# Patient Record
Sex: Female | Born: 2000 | Race: Black or African American | Hispanic: No | Marital: Single | Smoking: Never smoker
Health system: Southern US, Community
[De-identification: ages and names within clinical notes are randomized; demographics above are authoritative.]

## PROBLEM LIST (undated history)

## (undated) ENCOUNTER — Inpatient Hospital Stay (HOSPITAL_COMMUNITY): Payer: Self-pay

## (undated) DIAGNOSIS — D649 Anemia, unspecified: Secondary | ICD-10-CM

## (undated) DIAGNOSIS — Z789 Other specified health status: Secondary | ICD-10-CM

## (undated) HISTORY — PX: NO PAST SURGERIES: SHX2092

## (undated) HISTORY — DX: Other specified health status: Z78.9

---
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
## 2022-03-07 NOTE — L&D Delivery Note (Signed)
OB/GYN Faculty Practice Delivery Note  Katie Baldwin is a 22 y.o. G2P1001 s/p nsvd at [redacted]w[redacted]d. She was admitted for active labor.   ROM: 0h 61m with meconium-stained fluid GBS Status:  Negative/-- (10/25 0000) Maximum Maternal Temperature: 97.5  Labor Progress: Initial SVE: 8. She then progressed to complete with AROM performed after placement of spina.  Delivery Date/Time: 11:32 Delivery: Called to room and patient was complete and pushing. Head delivered OA. No nuchal cord present. Shoulder and body delivered in usual fashion. Infant with spontaneous cry, placed on mother's abdomen, dried and stimulated. Cord clamped x 2 after 1-minute delay, and cut by father. Cord blood drawn. Placenta delivered spontaneously with gentle cord traction. Fundus firm with massage and Pitocin. Labia, perineum, vagina, and cervix inspected inspected with hemostatic superficial peri-urethral lacerations that were not repaired, no perineal laceration .  Baby Weight: pending  Placenta: Sent to L&D Complications: None Lacerations: two hemostatic peri-urethral lacerations not repaired EBL: 102 mL Analgesia: Spinal   Infant:  APGAR (1 MIN):   APGAR (5 MINS):   APGAR (10 MINS):     Shonna Chock, MD [facility], [facility] 02/03/2023, 11:42 AM

## 2022-05-31 ENCOUNTER — Encounter (HOSPITAL_COMMUNITY): Payer: Self-pay | Admitting: Emergency Medicine

## 2022-05-31 ENCOUNTER — Ambulatory Visit (HOSPITAL_COMMUNITY)
Admission: EM | Admit: 2022-05-31 | Discharge: 2022-05-31 | Disposition: A | Discharge status: Home / Self Care | Payer: Medicaid Other | Attending: Family Medicine | Admitting: Family Medicine

## 2022-05-31 DIAGNOSIS — N910 Primary amenorrhea: Secondary | ICD-10-CM

## 2022-05-31 DIAGNOSIS — Z3201 Encounter for pregnancy test, result positive: Secondary | ICD-10-CM

## 2022-05-31 LAB — POC URINE PREG, ED: Preg Test, Ur: POSITIVE — AB

## 2022-05-31 NOTE — Discharge Instructions (Signed)
The pregnancy test is positive here.

## 2022-05-31 NOTE — ED Triage Notes (Signed)
Pt reports LMP 2/17, had one day this month where had dark brown blood. Reports home test positive for pregnancy, requesting testing to confirm.

## 2022-05-31 NOTE — ED Provider Notes (Signed)
[place]    CSN: [id number] Arrival date & time: 05/31/22  1359      History   Chief Complaint Chief Complaint  Patient presents with   Possible Pregnancy    HPI Katie Baldwin is a 22 y.o. female.    Possible Pregnancy   Here for irregular bleeding.  Last normal menstrual cycle was February 17.  Then on March 16 she had a little bit of spotting of some dark blood and then that resolved.  In the last week or 10 days, she has had some nausea but no vomiting.  Maybe some breast tenderness.  No cramping, but she is having a little pressure in her lower abdomen.  No dysuria.  Past Medical History:  Diagnosis Date   Anemia    Medical history non-contributory     Patient Active Problem List   Diagnosis Date Noted   Normal labor 07/29/2021   Gestational thrombocytopenia ([place]) 07/08/2021   Anemia of mother in pregnancy, antepartum 01/25/2021   Encounter for supervision of normal first pregnancy 01/18/2021    Past Surgical History:  Procedure Laterality Date   NO PAST SURGERIES      OB History     Gravida  1   Para  1   Term  1   Preterm      AB      Living  1      SAB      IAB      Ectopic      Multiple  0   Live Births  1            Home Medications    Prior to Admission medications   Not on File    Family History Family History  Problem Relation Age of Onset   Healthy Mother    Asthma Brother    Asthma Maternal Grandmother     Social History Social History   Tobacco Use   Smoking status: Never   Smokeless tobacco: Never  Vaping Use   Vaping Use: Never used  Substance Use Topics   Alcohol use: Never   Drug use: Not Currently    Types: Marijuana    Comment: last used marijuana 11/26/2020     Allergies   Patient has no known allergies.   Review of Systems Review of Systems   Physical Exam Triage Vital Signs ED Triage Vitals  Enc Vitals Group     BP 05/31/22 1445 107/61     Pulse Rate  05/31/22 1445 65     Resp 05/31/22 1445 14     Temp 05/31/22 1445 98.5 F (36.9 C)     Temp Source 05/31/22 1445 Oral     SpO2 05/31/22 1445 97 %     Weight --      Height --      Head Circumference --      Peak Flow --      Pain Score 05/31/22 1444 0     Pain Loc --      Pain Edu? --      Excl. in [place]? --    No data found.  Updated Vital Signs BP 107/61 (BP Location: Left Arm)   Pulse 65   Temp 98.5 F (36.9 C) (Oral)   Resp 14   LMP 04/23/2022   SpO2 97%   Visual Acuity Right Eye Distance:   Left Eye Distance:   Bilateral Distance:    Right Eye Near:   Left Eye  Near:    Bilateral Near:     Physical Exam Vitals reviewed.  Constitutional:      General: She is not in acute distress.    Appearance: She is not toxic-appearing.  HENT:     Mouth/Throat:     Mouth: Mucous membranes are moist.  Cardiovascular:     Rate and Rhythm: Normal rate and regular rhythm.  Pulmonary:     Effort: Pulmonary effort is normal.     Breath sounds: Normal breath sounds.  Abdominal:     Palpations: Abdomen is soft.     Tenderness: There is no abdominal tenderness.  Skin:    Coloration: Skin is not jaundiced or pale.  Neurological:     Mental Status: She is alert and oriented to person, place, and time.  Psychiatric:        Behavior: Behavior normal.      UC Treatments / Results  Labs (all labs ordered are listed, but only abnormal results are displayed) Labs Reviewed  POC URINE PREG, ED - Abnormal; Notable for the following components:      Result Value   Preg Test, Ur POSITIVE (*)    All other components within normal limits    EKG   Radiology No results found.  Procedures Procedures (including critical care time)  Medications Ordered in UC Medications - No data to display  Initial Impression / Assessment and Plan / UC Course  I have reviewed the triage vital signs and the nursing notes.  Pertinent labs & imaging results that were available during my care  of the patient were reviewed by me and considered in my medical decision making (see chart for details).        Her pregnancy test is positive.  She is given contact information for OB/GYN. Final Clinical Impressions(s) / UC Diagnoses   Final diagnoses:  Delayed period  Positive pregnancy test     Discharge Instructions      The pregnancy test is positive here.       ED Prescriptions   None    PDMP not reviewed this encounter.   Barrett Henle, MD 05/31/22 [phone]

## 2022-06-08 ENCOUNTER — Encounter (HOSPITAL_COMMUNITY): Payer: Self-pay | Admitting: *Deleted

## 2022-06-08 ENCOUNTER — Inpatient Hospital Stay (HOSPITAL_COMMUNITY)
Admission: AD | Admit: 2022-06-08 | Discharge: 2022-06-08 | Disposition: A | Discharge status: Home / Self Care | Payer: Medicaid Other | Attending: Obstetrics and Gynecology | Admitting: Obstetrics and Gynecology

## 2022-06-08 ENCOUNTER — Inpatient Hospital Stay (HOSPITAL_COMMUNITY): Payer: Medicaid Other

## 2022-06-08 DIAGNOSIS — O99891 Other specified diseases and conditions complicating pregnancy: Secondary | ICD-10-CM | POA: Insufficient documentation

## 2022-06-08 DIAGNOSIS — O26899 Other specified pregnancy related conditions, unspecified trimester: Secondary | ICD-10-CM

## 2022-06-08 DIAGNOSIS — Z3A01 Less than 8 weeks gestation of pregnancy: Secondary | ICD-10-CM | POA: Insufficient documentation

## 2022-06-08 DIAGNOSIS — O99282 Endocrine, nutritional and metabolic diseases complicating pregnancy, second trimester: Secondary | ICD-10-CM | POA: Diagnosis not present

## 2022-06-08 DIAGNOSIS — O99112 Other diseases of the blood and blood-forming organs and certain disorders involving the immune mechanism complicating pregnancy, second trimester: Secondary | ICD-10-CM | POA: Insufficient documentation

## 2022-06-08 DIAGNOSIS — D696 Thrombocytopenia, unspecified: Secondary | ICD-10-CM | POA: Insufficient documentation

## 2022-06-08 DIAGNOSIS — O26891 Other specified pregnancy related conditions, first trimester: Secondary | ICD-10-CM | POA: Insufficient documentation

## 2022-06-08 DIAGNOSIS — O99111 Other diseases of the blood and blood-forming organs and certain disorders involving the immune mechanism complicating pregnancy, first trimester: Secondary | ICD-10-CM | POA: Diagnosis not present

## 2022-06-08 DIAGNOSIS — R109 Unspecified abdominal pain: Secondary | ICD-10-CM

## 2022-06-08 DIAGNOSIS — O99281 Endocrine, nutritional and metabolic diseases complicating pregnancy, first trimester: Secondary | ICD-10-CM | POA: Diagnosis not present

## 2022-06-08 DIAGNOSIS — O219 Vomiting of pregnancy, unspecified: Secondary | ICD-10-CM

## 2022-06-08 LAB — URINALYSIS, ROUTINE W REFLEX MICROSCOPIC
Bilirubin Urine: NEGATIVE
Glucose, UA: NEGATIVE mg/dL
Hgb urine dipstick: NEGATIVE
Ketones, ur: 5 mg/dL — AB
Nitrite: NEGATIVE
Protein, ur: 30 mg/dL — AB
Specific Gravity, Urine: 1.028 (ref 1.005–1.030)
pH: 6 (ref 5.0–8.0)

## 2022-06-08 LAB — COMPREHENSIVE METABOLIC PANEL
ALT: 10 U/L (ref 0–44)
AST: 16 U/L (ref 15–41)
Albumin: 4.1 g/dL (ref 3.5–5.0)
Alkaline Phosphatase: 40 U/L (ref 38–126)
Anion gap: 7 (ref 5–15)
BUN: 7 mg/dL (ref 6–20)
CO2: 24 mmol/L (ref 22–32)
Calcium: 9.2 mg/dL (ref 8.9–10.3)
Chloride: 104 mmol/L (ref 98–111)
Creatinine, Ser: 0.59 mg/dL (ref 0.44–1.00)
GFR, Estimated: >60 mL/min (ref >60)
Glucose, Bld: 77 mg/dL (ref 70–99)
Potassium: 3.3 mmol/L — ABNORMAL LOW (ref 3.5–5.1)
Sodium: 135 mmol/L (ref 135–145)
Total Bilirubin: 0.6 mg/dL (ref 0.3–1.2)
Total Protein: 7.3 g/dL (ref 6.5–8.1)

## 2022-06-08 LAB — CBC
HCT: 32.2 % — ABNORMAL LOW (ref 36.0–46.0)
Hemoglobin: 10.1 g/dL — ABNORMAL LOW (ref 12.0–15.0)
MCH: 25 pg — ABNORMAL LOW (ref 26.0–34.0)
MCHC: 31.4 g/dL (ref 30.0–36.0)
MCV: 79.7 fL — ABNORMAL LOW (ref 80.0–100.0)
Platelets: 130 10*3/uL — ABNORMAL LOW (ref 150–400)
RBC: 4.04 MIL/uL (ref 3.87–5.11)
RDW: 15 % (ref 11.5–15.5)
WBC: 4.2 10*3/uL (ref 4.0–10.5)
nRBC: 0 % (ref 0.0–0.2)

## 2022-06-08 LAB — HCG, QUANTITATIVE, PREGNANCY: hCG, Beta Chain, Quant, S: 33014 m[IU]/mL — ABNORMAL HIGH (ref <5)

## 2022-06-08 MED ORDER — FAMOTIDINE IN NACL 20-0.9 MG/50ML-% IV SOLN
20.0000 mg | Freq: Once | INTRAVENOUS | Status: AC
  Administered 2022-06-08: 20 mg via INTRAVENOUS
  Filled 2022-06-08: qty 50

## 2022-06-08 MED ORDER — METOCLOPRAMIDE HCL 5 MG/ML IJ SOLN
10.0000 mg | Freq: Once | INTRAMUSCULAR | Status: AC
  Administered 2022-06-08: 10 mg via INTRAVENOUS
  Filled 2022-06-08: qty 2

## 2022-06-08 MED ORDER — FAMOTIDINE 20 MG PO TABS: 20.0000 mg | ORAL_TABLET | Freq: Two times a day (BID) | ORAL | 3 refills | Status: DC

## 2022-06-08 MED ORDER — LACTATED RINGERS IV BOLUS
1000.0000 mL | Freq: Once | INTRAVENOUS | Status: AC
  Administered 2022-06-08: 1000 mL via INTRAVENOUS

## 2022-06-08 MED ORDER — METOCLOPRAMIDE HCL 10 MG PO TABS: 10.0000 mg | ORAL_TABLET | Freq: Four times a day (QID) | ORAL | 3 refills | Status: DC

## 2022-06-08 NOTE — Discharge Instructions (Signed)
Safe Medications in Pregnancy    Acne: Benzoyl Peroxide Salicylic Acid  Backache/Headache: Tylenol: 2 regular strength every 4 hours OR              2 Extra strength every 6 hours  Colds/Coughs/Allergies: Benadryl (alcohol free) 25 mg every 6 hours as needed Breath right strips Claritin Cepacol throat lozenges Chloraseptic throat spray Cold-Eeze- up to three times per day Cough drops, alcohol free Flonase (by prescription only) Guaifenesin Mucinex Robitussin DM (plain only, alcohol free) Saline nasal spray/drops Sudafed (pseudoephedrine) & Actifed ** use only after [redacted] weeks gestation and if you do not have high blood pressure Tylenol Vicks Vaporub Zinc lozenges Zyrtec   Constipation: Colace Ducolax suppositories Fleet enema Glycerin suppositories Metamucil Milk of magnesia Miralax Senokot Smooth move tea  Diarrhea: Kaopectate Imodium A-D  *NO pepto Bismol  Hemorrhoids: Anusol Anusol HC Preparation H Tucks  Indigestion: Tums Maalox Mylanta Zantac  Pepcid  Insomnia: Benadryl (alcohol free) 25mg every 6 hours as needed Tylenol PM Unisom, no Gelcaps  Leg Cramps: Tums MagGel  Nausea/Vomiting:  Bonine Dramamine Emetrol Ginger extract Sea bands Meclizine  Nausea medication to take during pregnancy:  Unisom (doxylamine succinate 25 mg tablets) Take one tablet daily at bedtime. If symptoms are not adequately controlled, the dose can be increased to a maximum recommended dose of two tablets daily (1/2 tablet in the morning, 1/2 tablet mid-afternoon and one at bedtime). Vitamin B6 100mg tablets. Take one tablet twice a day (up to 200 mg per day).  Skin Rashes: Aveeno products Benadryl cream or 25mg every 6 hours as needed Calamine Lotion 1% cortisone cream  Yeast infection: Gyne-lotrimin 7 Monistat 7   **If taking multiple medications, please check labels to avoid duplicating the same active ingredients **take  medication as directed on the label ** Do not exceed 4000 mg of tylenol in 24 hours **Do not take medications that contain aspirin or ibuprofen   [facility] Providers   [facility]             [address], [place], [place] [address] [phone]  [facility]                                                             [address], [address], [place], [place], [address] [phone]  [facility]                                    [address], [address], [place], [place], [address] [phone]  [facility] [place] [address], [address], [place], [place], [address] [phone]  [facility]                                 [address], [place], [place], [address] [phone]  [facility]                                      [address], [place], [place], [address] [phone]  [facility] [address], [address], [place], [place], [address]                              [facility] [address], [place], [place], [address] [phone]  [facility]         Phone: [phone]  [facility]      Phone: [phone]   [facility]      Phone: [phone]  [facility]         Phone: [phone]   [facility]    Phone: [phone]  [facility]      Phone: [phone]  [facility]     Phone: [phone]  [facility]        Phone: [phone]  [facility]      Phone: [phone]  

## 2022-06-08 NOTE — MAU Provider Note (Cosign Needed Addendum)
History     CSN: [id number]  Arrival date and time: 06/08/22 1840   Event Date/Time   First Provider Initiated Contact with Patient 06/08/22 1936      Chief Complaint  Patient presents with   Abdominal Pain   Emesis   Abdominal Pain Associated symptoms include constipation, diarrhea, nausea and vomiting. Pertinent negatives include no dysuria, fever, frequency, headaches, myalgias, rash or sore throat.  Emesis  Associated symptoms include abdominal pain, diarrhea and dizziness. Pertinent negatives include no chest pain, chills, coughing, fever, headaches or myalgias.   Pt is a 22 y.o. at [redacted]w[redacted]d presenting for vomiting and abdominal pain. The pain and vomiting started 3 days ago. States the pain has been gradually getting worse and is primarily in her epigastric region. Pain is rated 5/10 and is constant. She also reports extreme nausea and vomiting the past 3 days. She has not been able to keep any food or fluids down. Reports throwing up around 15x in the last 24 hours. She tried to eat a banana and ginger ale today and was unable to keep it down. She also reports weakness, lightheadedness, dizziness, and constipation followed by diarrhea. She denies any fever, chills, bloating, chest pain, or vaginal bleeding.  OB History     Gravida  2   Para  1   Term  1   Preterm      AB      Living  1      SAB      IAB      Ectopic      Multiple  0   Live Births  1           Past Medical History:  Diagnosis Date   Anemia    Medical history non-contributory     Past Surgical History:  Procedure Laterality Date   NO PAST SURGERIES      Family History  Problem Relation Age of Onset   Healthy Mother    Asthma Brother    Asthma Maternal Grandmother     Social History   Tobacco Use   Smoking status: Never   Smokeless tobacco: Never  Vaping Use   Vaping Use: Never used  Substance Use Topics   Alcohol use: Never   Drug use: Not Currently    Types: Marijuana     Comment: last used marijuana 11/26/2020    Allergies: No Known Allergies  No medications prior to admission.    Review of Systems  Constitutional:  Positive for fatigue. Negative for chills and fever.  HENT:  Negative for congestion, rhinorrhea and sore throat.   Eyes:  Negative for discharge and redness.  Respiratory:  Negative for cough and shortness of breath.   Cardiovascular:  Negative for chest pain.  Gastrointestinal:  Positive for abdominal pain, constipation, diarrhea, nausea and vomiting. Negative for abdominal distention.  Genitourinary:  Negative for dysuria, frequency, urgency, vaginal bleeding and vaginal discharge.  Musculoskeletal:  Negative for back pain and myalgias.  Skin:  Negative for pallor and rash.  Neurological:  Positive for dizziness, weakness and light-headedness. Negative for syncope and headaches.  Psychiatric/Behavioral:  Negative for agitation and behavioral problems.    Physical Exam   Blood pressure 131/66, pulse 81, temperature 99.1 F (37.3 C), temperature source Oral, resp. rate 16, height 5\' 3"  (1.6 m), weight 53.3 kg, last menstrual period 04/23/2022, SpO2 98 %, unknown if currently breastfeeding.  Physical Exam Constitutional:      General: She is not in acute  distress.    Appearance: She is well-developed.  HENT:     Head: Normocephalic and atraumatic.  Cardiovascular:     Rate and Rhythm: Normal rate and regular rhythm.  Pulmonary:     Effort: Pulmonary effort is normal.     Breath sounds: Normal breath sounds.  Abdominal:     General: Abdomen is flat. Bowel sounds are normal. There is no distension.     Palpations: Abdomen is soft.     Tenderness: There is abdominal tenderness in the right upper quadrant, right lower quadrant, epigastric area and left upper quadrant. There is no guarding.  Skin:    General: Skin is warm and dry.  Neurological:     General: No focal deficit present.     Mental Status: She is alert and oriented  to person, place, and time.  Psychiatric:        Mood and Affect: Mood normal.        Behavior: Behavior normal.     MAU Course  Procedures Results for orders placed or performed during the hospital encounter of 06/08/22 (from the past 24 hour(s))  Urinalysis, Routine w reflex microscopic -Urine, Clean Catch     Status: Abnormal   Collection Time: 06/08/22  7:15 PM  Result Value Ref Range   Color, Urine YELLOW YELLOW   APPearance HAZY (A) CLEAR   Specific Gravity, Urine 1.028 1.005 - 1.030   pH 6.0 5.0 - 8.0   Glucose, UA NEGATIVE NEGATIVE mg/dL   Hgb urine dipstick NEGATIVE NEGATIVE   Bilirubin Urine NEGATIVE NEGATIVE   Ketones, ur 5 (A) NEGATIVE mg/dL   Protein, ur 30 (A) NEGATIVE mg/dL   Nitrite NEGATIVE NEGATIVE   Leukocytes,Ua TRACE (A) NEGATIVE   RBC / HPF 0-5 0 - 5 RBC/hpf   WBC, UA 0-5 0 - 5 WBC/hpf   Bacteria, UA RARE (A) NONE SEEN   Squamous Epithelial / HPF 0-5 0 - 5 /HPF   Mucus PRESENT   CBC     Status: Abnormal   Collection Time: 06/08/22  8:00 PM  Result Value Ref Range   WBC 4.2 4.0 - 10.5 K/uL   RBC 4.04 3.87 - 5.11 MIL/uL   Hemoglobin 10.1 (L) 12.0 - 15.0 g/dL   HCT 32.2 (L) 36.0 - 46.0 %   MCV 79.7 (L) 80.0 - 100.0 fL   MCH 25.0 (L) 26.0 - 34.0 pg   MCHC 31.4 30.0 - 36.0 g/dL   RDW 15.0 11.5 - 15.5 %   Platelets 130 (L) 150 - 400 K/uL   nRBC 0.0 0.0 - 0.2 %  Comprehensive metabolic panel     Status: Abnormal   Collection Time: 06/08/22  8:00 PM  Result Value Ref Range   Sodium 135 135 - 145 mmol/L   Potassium 3.3 (L) 3.5 - 5.1 mmol/L   Chloride 104 98 - 111 mmol/L   CO2 24 22 - 32 mmol/L   Glucose, Bld 77 70 - 99 mg/dL   BUN 7 6 - 20 mg/dL   Creatinine, Ser 0.59 0.44 - 1.00 mg/dL   Calcium 9.2 8.9 - 10.3 mg/dL   Total Protein 7.3 6.5 - 8.1 g/dL   Albumin 4.1 3.5 - 5.0 g/dL   AST 16 15 - 41 U/L   ALT 10 0 - 44 U/L   Alkaline Phosphatase 40 38 - 126 U/L   Total Bilirubin 0.6 0.3 - 1.2 mg/dL   GFR, Estimated >60 >60 mL/min   Anion gap 7 5 -  63   US OB LESS THAN 14 WEEKS WITH OB TRANSVAGINAL  Result Date: 06/08/2022 CLINICAL DATA:  Abdominal pain for 3 days, pregnant EXAM: OBSTETRIC <14 WK [place] AND TRANSVAGINAL OB US TECHNIQUE: Both transabdominal and transvaginal ultrasound examinations were performed for complete evaluation of the gestation as well as the maternal uterus, adnexal regions, and pelvic cul-de-sac. Transvaginal technique was performed to assess early pregnancy. COMPARISON:  None Available. FINDINGS: Intrauterine gestational sac: Single Yolk sac:  Visualized. Embryo:  Visualized. Cardiac Activity: Visualized. Heart Rate: 102 bpm CRL:  3.4 mm   6 w   0 d                  [place] EDC: 02/01/2023 Subchorionic hemorrhage:  None visualized. Maternal uterus/adnexae: There are no adnexal masses. Trace pelvic free fluid likely physiologic. IMPRESSION: 1. Single live intrauterine pregnancy as above, estimated age 39 weeks and 0 days. 2. Trace pelvic free fluid, likely physiologic. Electronically Signed   By: Randa Ngo M.D.   On: 06/08/2022 20:59    MDM UA: Ketones 5. Protein 30, Trace leukocyte, rare bacteria  CBC: Hgb 10.1, Hct 32.2, Plt 130  CMP: K 3.3 Transvaginal US: IUP present  Reglan, Pepcid, Zofran and Lactated Ringers   Assessment and Plan  Nausea and Vomiting Abdominal Pain - Pepcid, Reglan, and Zofran sent to pharmacy - Encouraged bland diet and lots of fluids - Hospital precautions given   Thrombocytopenia  -Plt count improved since 07/29/21 -Continue to monitor levels   6w Gestation -US shows IUP -[facility] to establish [facility] 06/08/2022, 9:42 PM   CNM attestation:  I have seen and examined this patient and agree with above documentation in the PA student's note.   Katie Baldwin is a 22 y.o. G2P1001 at [redacted]w[redacted]d reporting nausea, vomiting, and abdominal pain. Patient reports symptoms started 3 days ago. She reports she has not been able to keep anything down. She endorses  approximately 15 episodes of emesis in the last 24 hours. She tried to eat a banana and drink ginger ale prior to coming in and was not able to keep it down. She is reporting some pain in her mid to upper abdomen. Pain is constant and is described as a burning sensation. She also reports feeling overall weak and dehydrated. Patient denies vaginal bleeding, fever, or urinary s/s.   PE: Patient Vitals for the past 24 hrs:  BP Temp Temp src Pulse Resp SpO2 Height Weight  06/08/22 1900 131/66 99.1 F (37.3 C) Oral 81 16 98 % 5\' 3"  (1.6 m) 53.3 kg   Gen: calm, comfortable, no acute distress Resp: normal effort, no distress Heart: regular rate Abd: soft, some generalized tenderness, no guarding  Results for orders placed or performed during the hospital encounter of 06/08/22 (from the past 24 hour(s))  Urinalysis, Routine w reflex microscopic -Urine, Clean Catch     Status: Abnormal   Collection Time: 06/08/22  7:15 PM  Result Value Ref Range   Color, Urine YELLOW YELLOW   APPearance HAZY (A) CLEAR   Specific Gravity, Urine 1.028 1.005 - 1.030   pH 6.0 5.0 - 8.0   Glucose, UA NEGATIVE NEGATIVE mg/dL   Hgb urine dipstick NEGATIVE NEGATIVE   Bilirubin Urine NEGATIVE NEGATIVE   Ketones, ur 5 (A) NEGATIVE mg/dL   Protein, ur 30 (A) NEGATIVE mg/dL   Nitrite NEGATIVE NEGATIVE   Leukocytes,Ua TRACE (A) NEGATIVE   RBC / HPF 0-5 0 - 5  RBC/hpf   WBC, UA 0-5 0 - 5 WBC/hpf   Bacteria, UA RARE (A) NONE SEEN   Squamous Epithelial / HPF 0-5 0 - 5 /HPF   Mucus PRESENT   CBC     Status: Abnormal   Collection Time: 06/08/22  8:00 PM  Result Value Ref Range   WBC 4.2 4.0 - 10.5 K/uL   RBC 4.04 3.87 - 5.11 MIL/uL   Hemoglobin 10.1 (L) 12.0 - 15.0 g/dL   HCT 32.2 (L) 36.0 - 46.0 %   MCV 79.7 (L) 80.0 - 100.0 fL   MCH 25.0 (L) 26.0 - 34.0 pg   MCHC 31.4 30.0 - 36.0 g/dL   RDW 15.0 11.5 - 15.5 %   Platelets 130 (L) 150 - 400 K/uL   nRBC 0.0 0.0 - 0.2 %  Comprehensive metabolic panel     Status:  Abnormal   Collection Time: 06/08/22  8:00 PM  Result Value Ref Range   Sodium 135 135 - 145 mmol/L   Potassium 3.3 (L) 3.5 - 5.1 mmol/L   Chloride 104 98 - 111 mmol/L   CO2 24 22 - 32 mmol/L   Glucose, Bld 77 70 - 99 mg/dL   BUN 7 6 - 20 mg/dL   Creatinine, Ser 0.59 0.44 - 1.00 mg/dL   Calcium 9.2 8.9 - 10.3 mg/dL   Total Protein 7.3 6.5 - 8.1 g/dL   Albumin 4.1 3.5 - 5.0 g/dL   AST 16 15 - 41 U/L   ALT 10 0 - 44 U/L   Alkaline Phosphatase 40 38 - 126 U/L   Total Bilirubin 0.6 0.3 - 1.2 mg/dL   GFR, Estimated >60 >60 mL/min   Anion gap 7 5 - 15   US OB LESS THAN 14 WEEKS WITH OB TRANSVAGINAL  Result Date: 06/08/2022 CLINICAL DATA:  Abdominal pain for 3 days, pregnant EXAM: OBSTETRIC <14 WK [place] AND TRANSVAGINAL OB US TECHNIQUE: Both transabdominal and transvaginal ultrasound examinations were performed for complete evaluation of the gestation as well as the maternal uterus, adnexal regions, and pelvic cul-de-sac. Transvaginal technique was performed to assess early pregnancy. COMPARISON:  None Available. FINDINGS: Intrauterine gestational sac: Single Yolk sac:  Visualized. Embryo:  Visualized. Cardiac Activity: Visualized. Heart Rate: 102 bpm CRL:  3.4 mm   6 w   0 d                  [place] EDC: 02/01/2023 Subchorionic hemorrhage:  None visualized. Maternal uterus/adnexae: There are no adnexal masses. Trace pelvic free fluid likely physiologic. IMPRESSION: 1. Single live intrauterine pregnancy as above, estimated age 46 weeks and 0 days. 2. Trace pelvic free fluid, likely physiologic. Electronically Signed   By: Randa Ngo M.D.   On: 06/08/2022 20:59    ROS, labs, PMH reviewed  Orders Placed This Encounter  Procedures   US OB LESS THAN 14 WEEKS WITH OB TRANSVAGINAL   Urinalysis, Routine w reflex microscopic -Urine, Clean Catch   CBC   Comprehensive metabolic panel   hCG, quantitative, pregnancy   Discharge patient   Meds ordered this encounter  Medications   lactated ringers bolus  1,000 mL   metoCLOPramide (REGLAN) injection 10 mg   famotidine (PEPCID) IVPB 20 mg premix   famotidine (PEPCID) 20 MG tablet    Sig: Take 1 tablet (20 mg total) by mouth 2 (two) times daily.    Dispense:  60 tablet    Refill:  3    Order Specific Question:  Supervising Provider    Answer:   Clarnce Flock [id number]   metoCLOPramide (REGLAN) 10 MG tablet    Sig: Take 1 tablet (10 mg total) by mouth every 6 (six) hours.    Dispense:  60 tablet    Refill:  3    Order Specific Question:   Supervising Provider    Answer:   Clarnce Flock [id number]    MDM UA CBC, CMP, HCG [place] LR bolus, Reglan, Pepcid  UA and labs reassuring. Nausea improved with medications. No episodes of vomiting in MAU. US shows SIUP with FHR present.  Assessment: 1. [redacted] weeks gestation of pregnancy   2. Abdominal pain affecting pregnancy   3. Nausea and vomiting during pregnancy prior to [redacted] weeks gestation     Plan: - Discharge home in stable condition - Return precautions given. Return to MAU as needed - Rx for Reglan and Pepcid sent - List of OBGYN's provided. Call to establish [place], CNM 06/08/2022 9:53 PM

## 2022-06-08 NOTE — MAU Note (Signed)
Katie Baldwin is a 22 y.o. at [redacted]w[redacted]d here in MAU reporting: found out preg last wk.  Has been having abd pain, started 3 days ago, mid to upper abd. Pain keeps her awake, makes her toss and turn.  Can't keep down anything, this also started about 3 days. Any time she throws up, she gets dizzy.  Has been feeling weak.  Been trying not to come, but the pain got a little worse today.  Muscle pain from throwing up. Has had a little diarrhea today.  Onset of complaint: 3 days Pain score: 5 Vitals:   06/08/22 1900  BP: 131/66  Pulse: 81  Resp: 16  Temp: 99.1 F (37.3 C)  SpO2: 98%      Lab orders placed from triage:  urine

## 2022-07-17 IMAGING — US US OB < 14 WEEKS - US OB TV
1 series · 15 of 28 positions shown · non-contrast
Comparison: None.

CLINICAL DATA: Vaginal bleeding.

EXAM:
OBSTETRIC <14 WK US AND TRANSVAGINAL OB US
TECHNIQUE: Both transabdominal and transvaginal ultrasound examinations were
performed for complete evaluation of the gestation as well as the
maternal uterus, adnexal regions, and pelvic cul-de-sac.
Transvaginal technique was performed to assess early pregnancy.

[Series 1: us ob < 14 weeks - us ob tv · 15 of 57 slices shown]
[im 1/57]
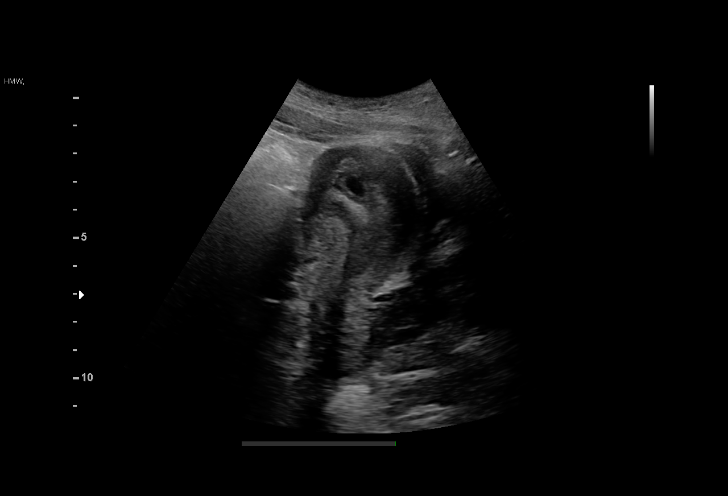
[im 5/57]
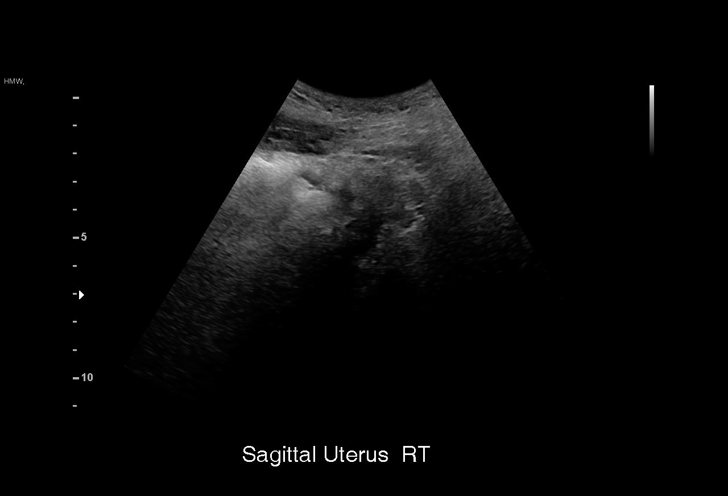
[im 9/57]
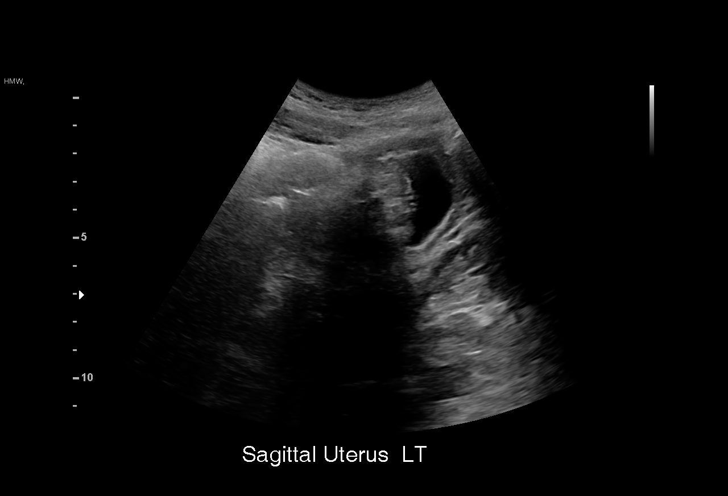
[im 13/57]
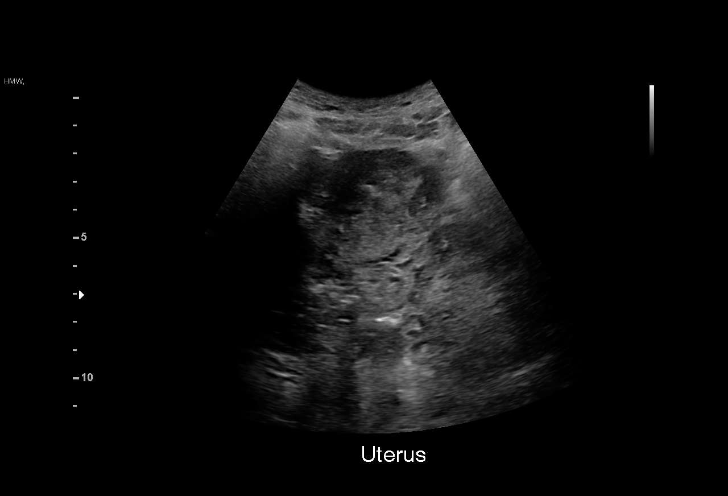
[im 17/57]
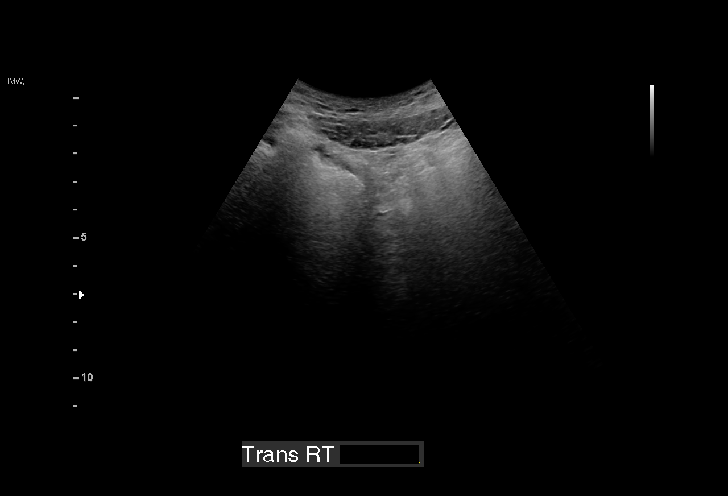
[im 21/57]
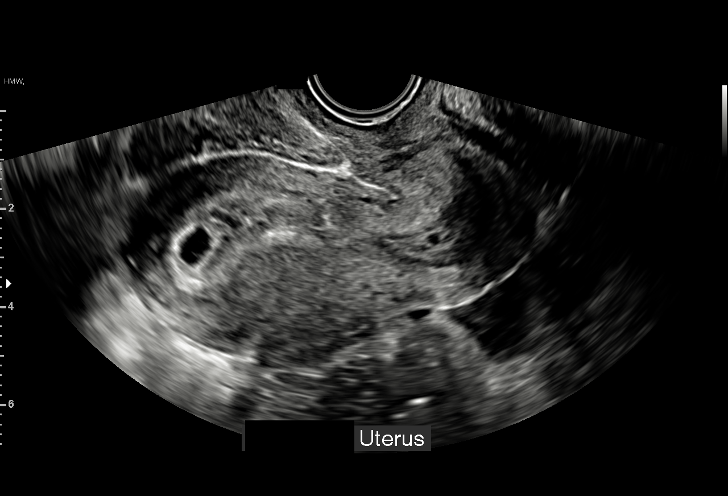
[im 25/57]
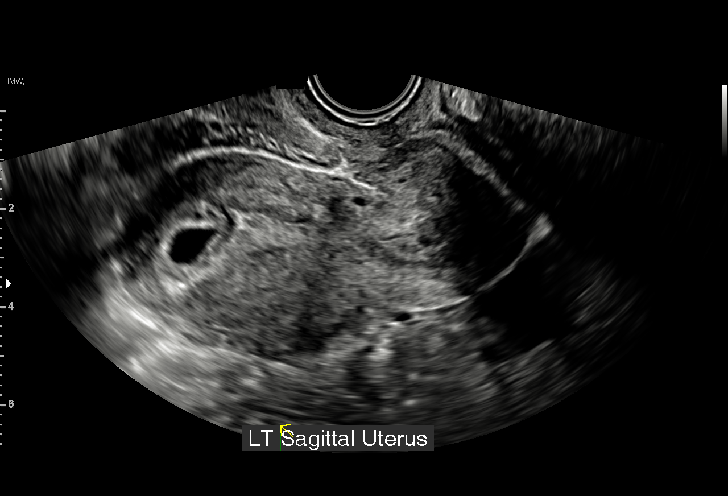
[im 30/57]
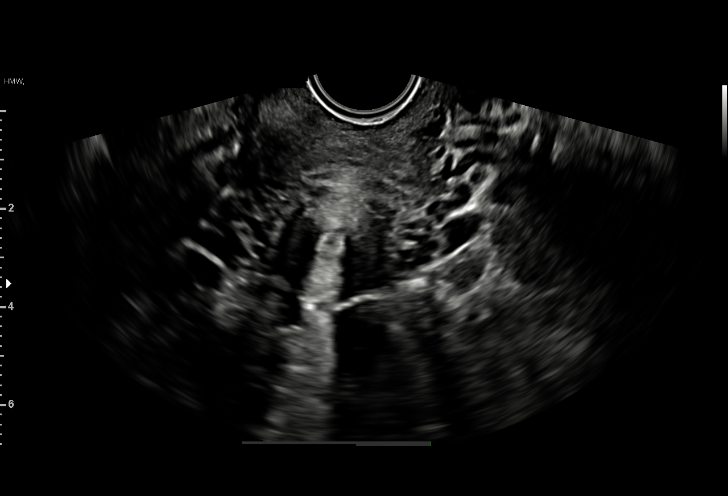
[im 32/57]
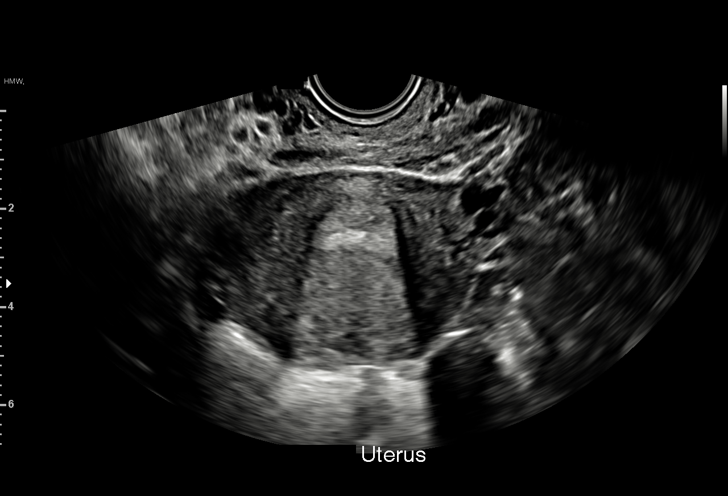
[im 36/57]
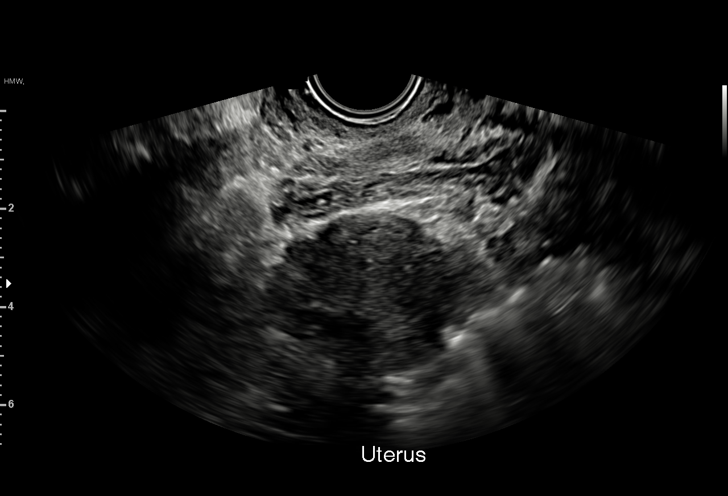
[im 40/57]
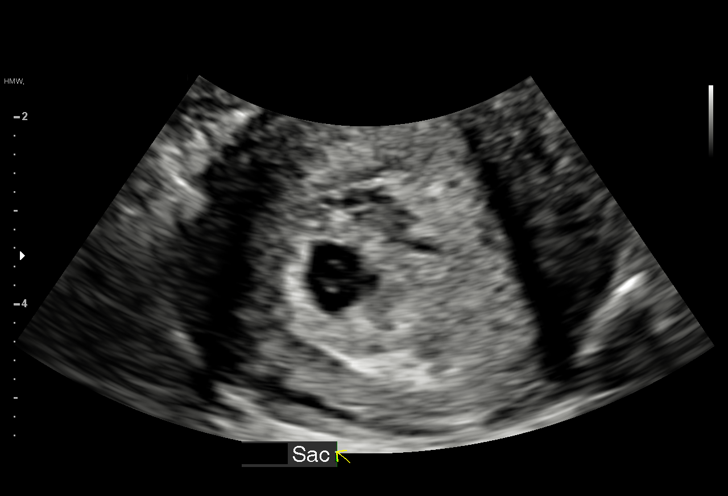
[im 44/57]
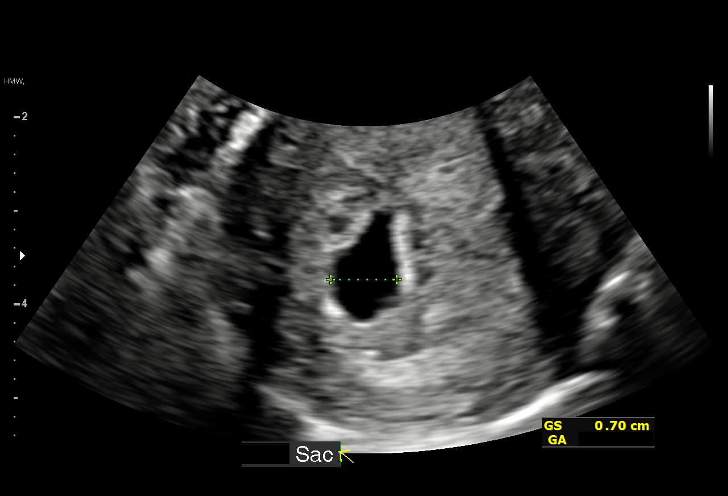
[im 48/57]
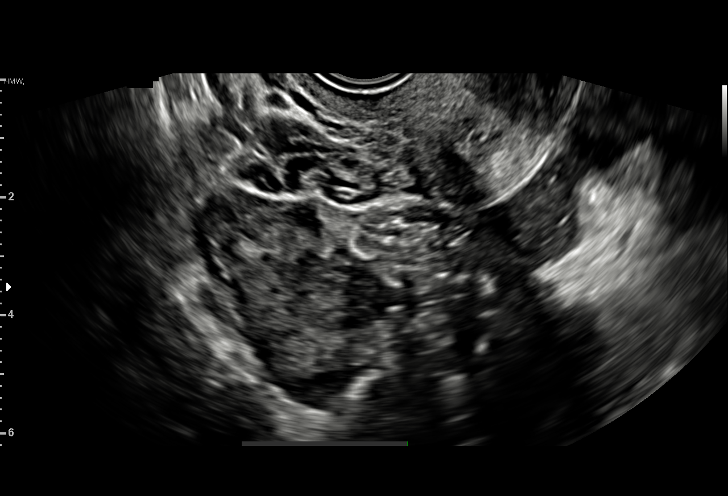
[im 52/57]
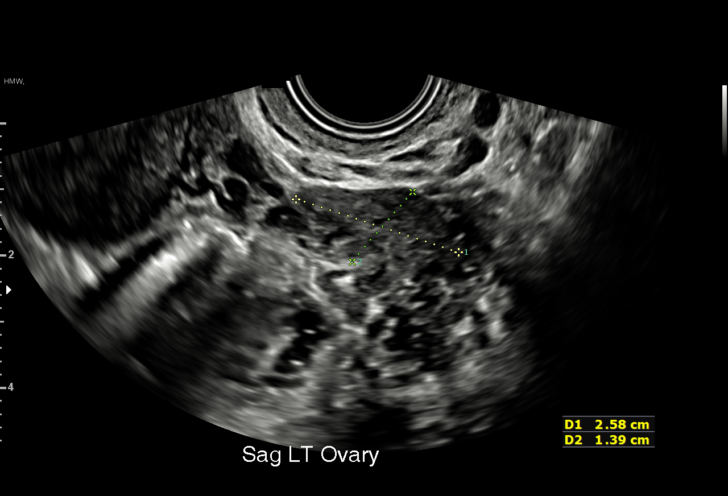
[im 57/57]
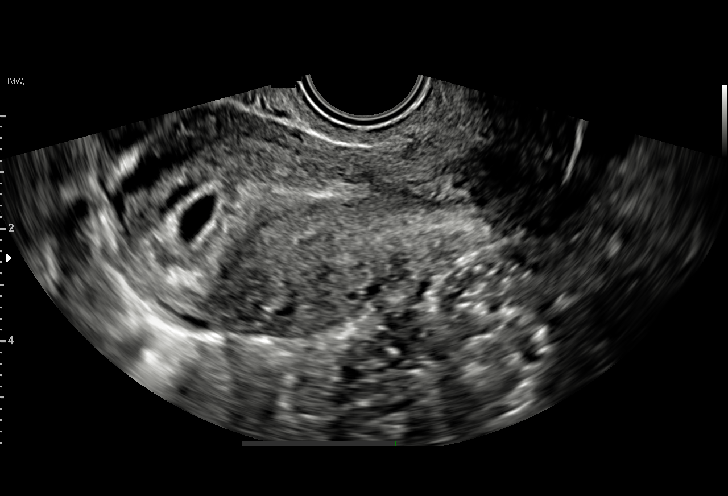

[15 of 28 positions shown; findings below may reference images not displayed]

FINDINGS: Intrauterine gestational sac: Present

Yolk sac:  Present

Embryo:  None

Cardiac Activity: N/a

Heart Rate: N/a bpm

MSD: 10.9 mm   5 w   5 d

Subchorionic hemorrhage:  Small to moderate

Maternal uterus/adnexae: Normal ovaries.
IMPRESSION: Intrauterine gestational sac estimated at 5 weeks and 5 days
gestation. A yolk sac is present but no embryonic pole. Recommend
follow-up quantitative B-HCG levels and follow-up US in 14 days to
assess viability. This recommendation follows SRU consensus
guidelines: Diagnostic Criteria for Nonviable Pregnancy Early in the
First Trimester. N Engl J Med 7279; [DATE].

Small to moderate-sized subchorionic hemorrhage.

## 2022-08-02 IMAGING — US US OB TRANSVAGINAL
1 series · 15 of 28 positions shown · non-contrast
Comparison: 12/01/2020

CLINICAL DATA: Follow-up, confirm viability

EXAM:
TRANSVAGINAL OB ULTRASOUND
TECHNIQUE: Transvaginal ultrasound was performed for complete evaluation of the
gestation as well as the maternal uterus, adnexal regions, and
pelvic cul-de-sac.

[Series 1: us ob transvaginal · 15 of 66 slices shown]
[im 1/66]
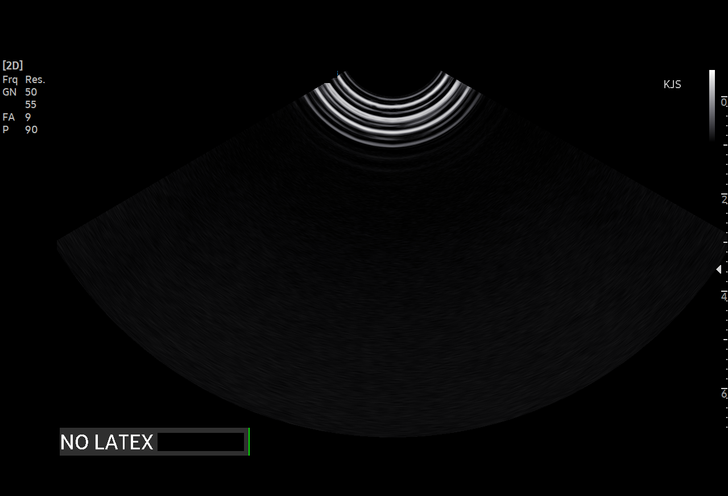
[im 5/66]
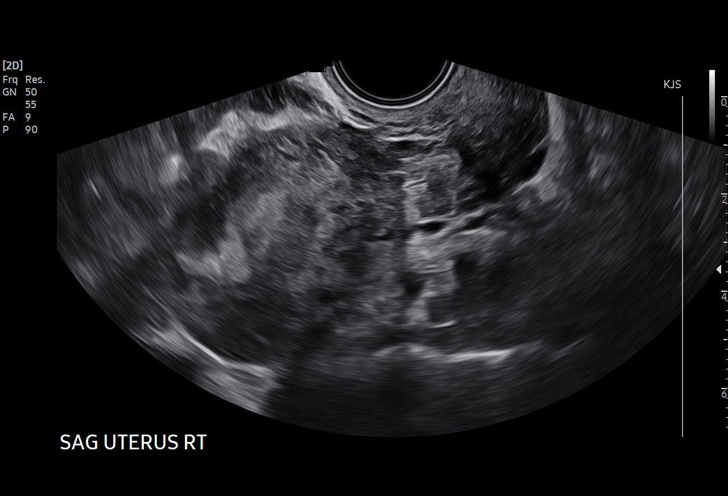
[im 10/66]
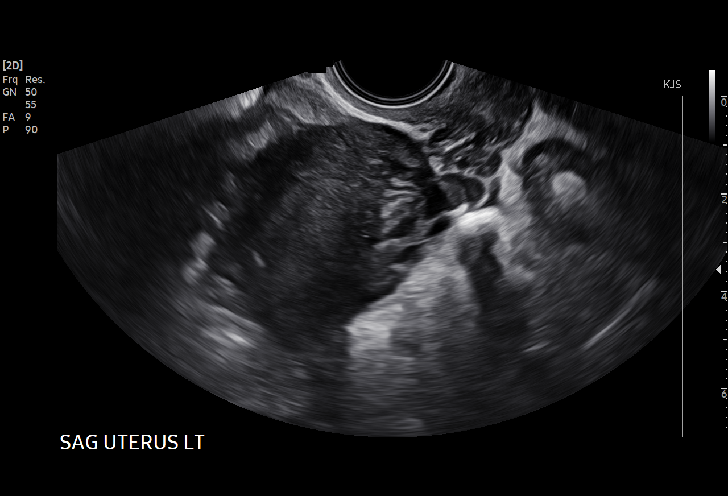
[im 15/66]
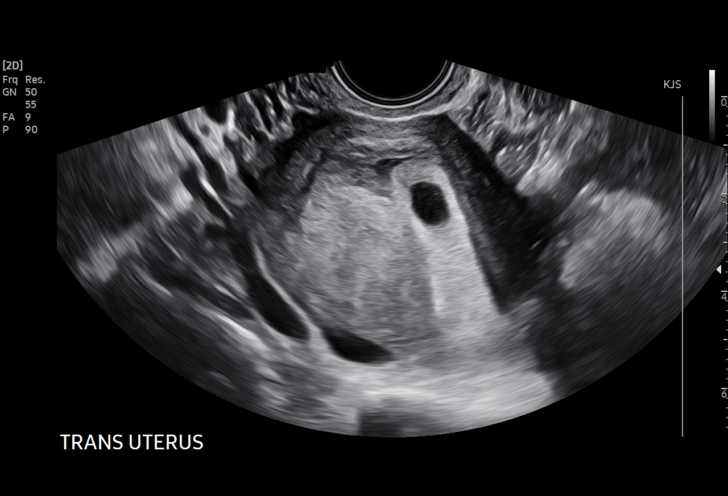
[im 20/66]
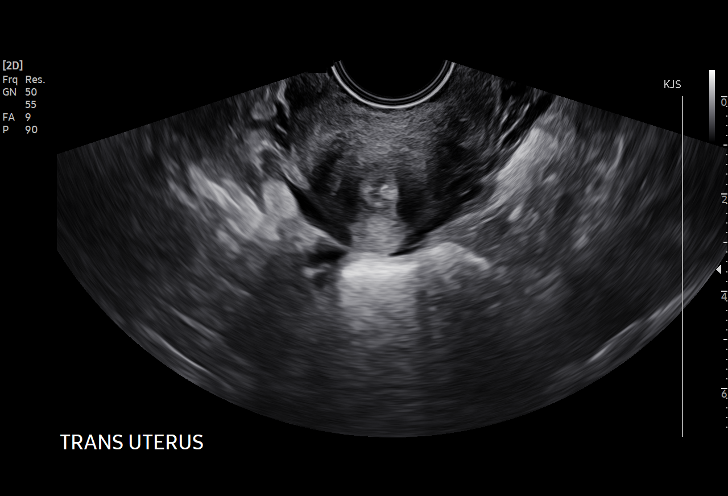
[im 25/66]
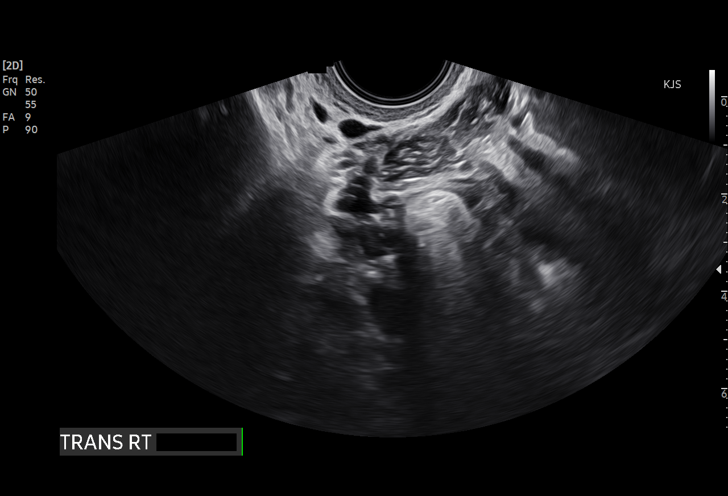
[im 29/66]
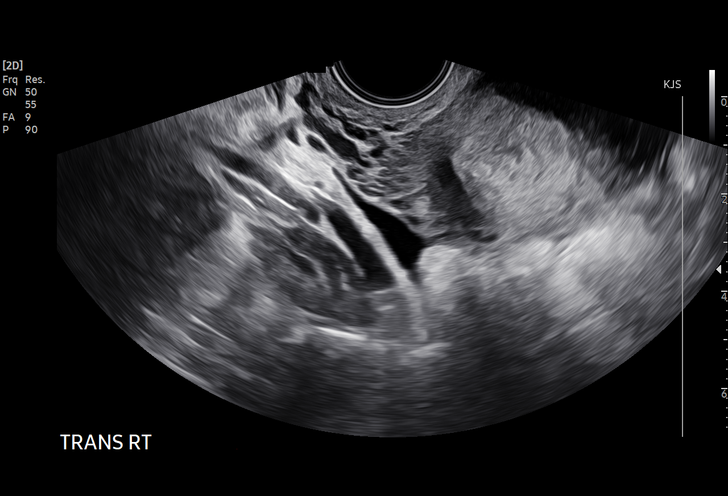
[im 34/66]
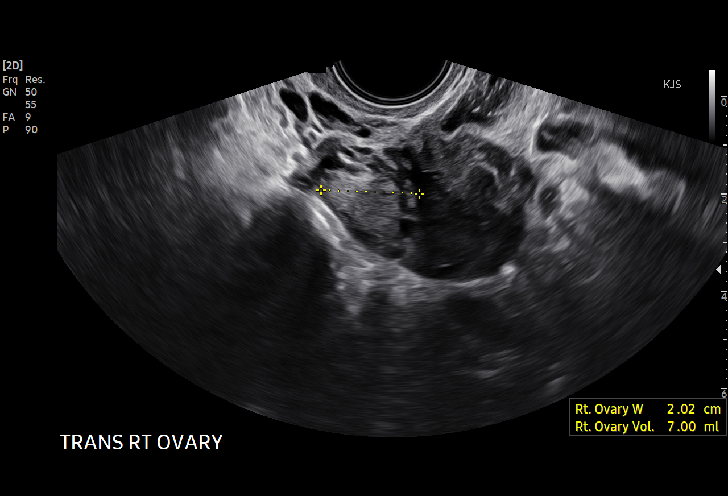
[im 37/66]
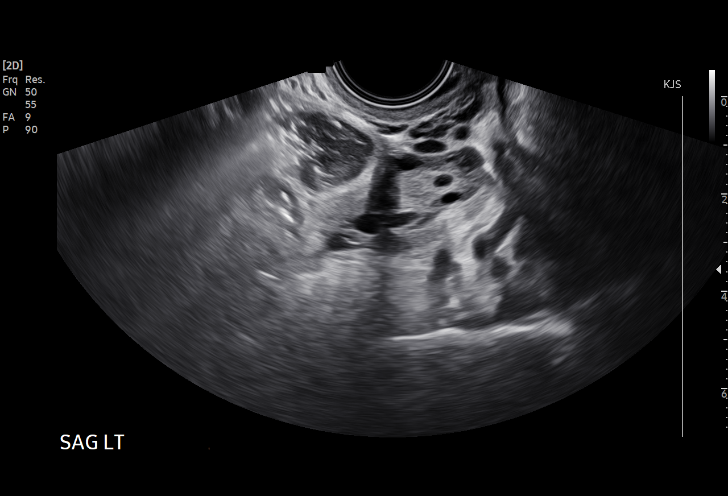
[im 41/66]
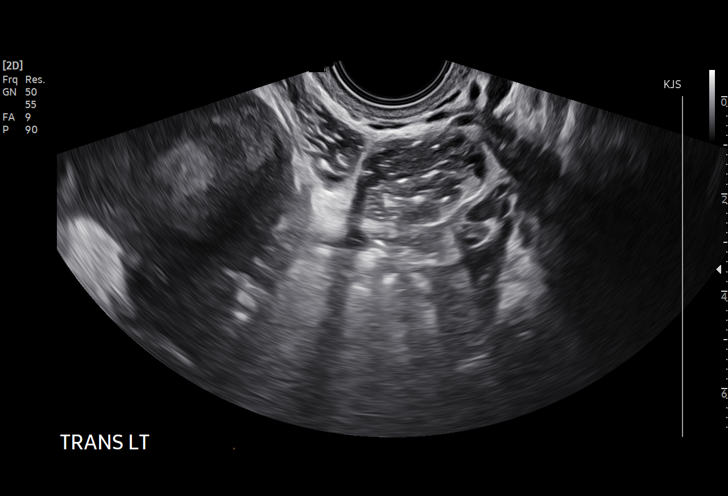
[im 46/66]
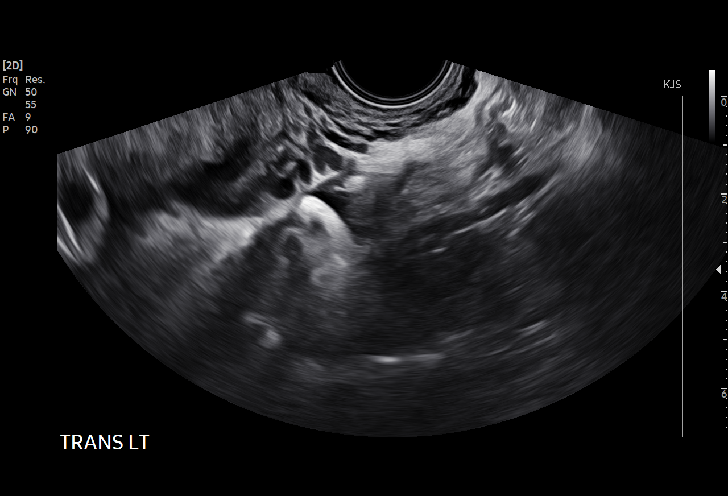
[im 51/66]
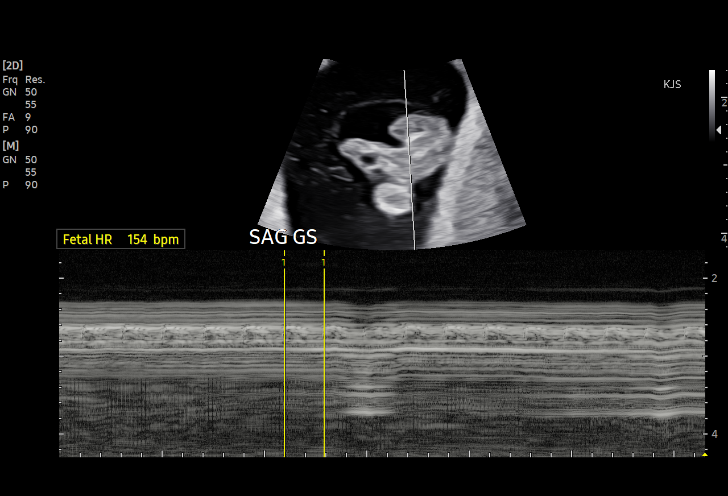
[im 56/66]
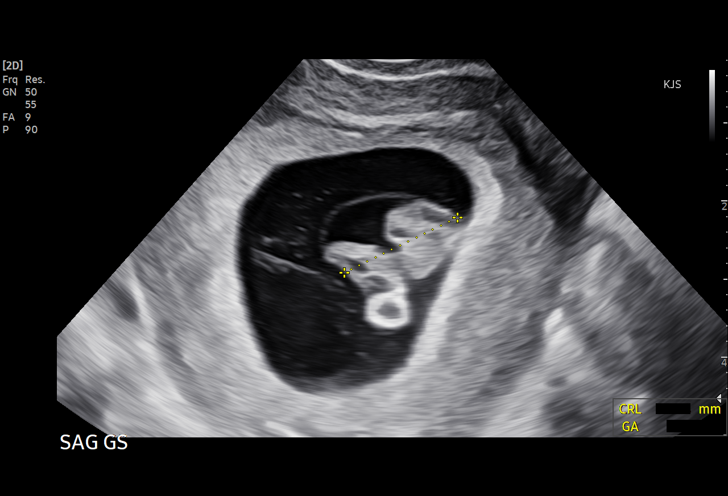
[im 61/66]
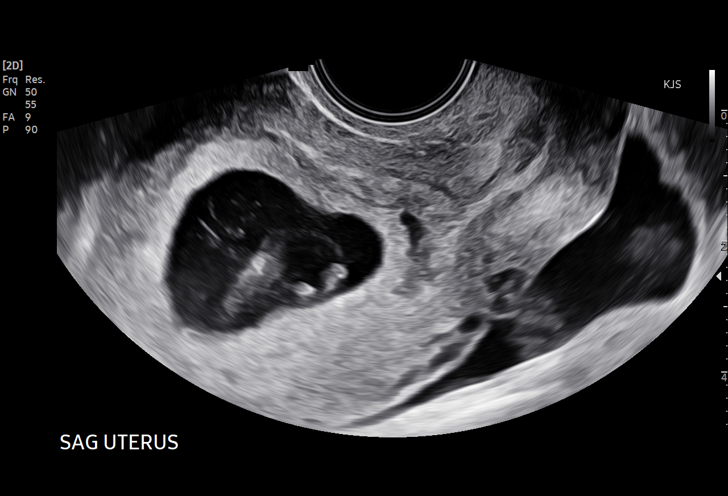
[im 66/66]
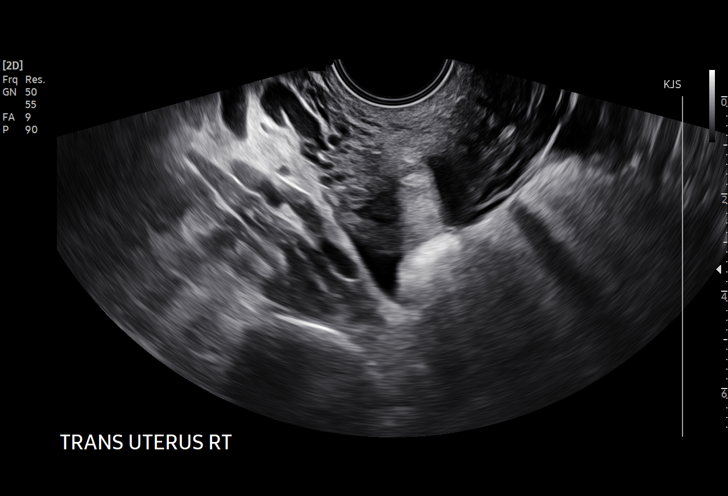

[15 of 28 positions shown; findings below may reference images not displayed]

FINDINGS: Intrauterine gestational sac: Single

Yolk sac:  Visualized.

Embryo:  Visualized.

Cardiac Activity: Visualized.

Heart Rate: 154 bpm

CRL:   16 mm   8 w 0 d                  US EDC: 07/29/2021

Subchorionic hemorrhage:  Small.

Maternal uterus/adnexae: Unremarkable.
IMPRESSION: 1. Single intrauterine gestation at sonographic gestational age of 8
weeks, 0 days. Fetal heart rate 154 bpm. EDD 07/29/2021.

2.  Small subchorionic hemorrhage.

## 2022-09-30 IMAGING — US US ABDOMEN LIMITED
1 series · 15 of 25 positions shown · non-contrast
Comparison: None.

CLINICAL DATA: Epigastric pain, evaluate for gallstones

EXAM:
ULTRASOUND ABDOMEN LIMITED RIGHT UPPER QUADRANT

[Series 1: us abdomen limited · 15 of 42 slices shown]
[im 1/42]
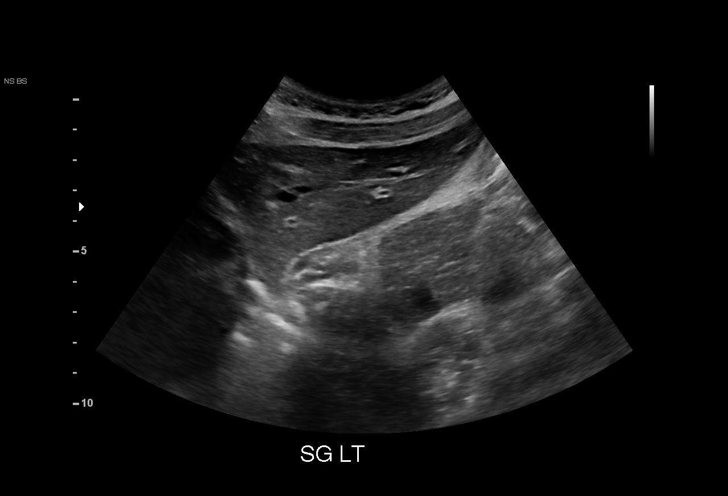
[im 4/42]
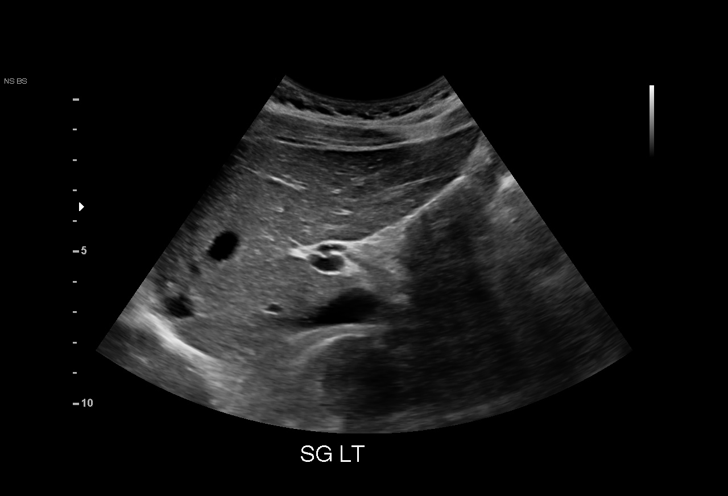
[im 7/42]
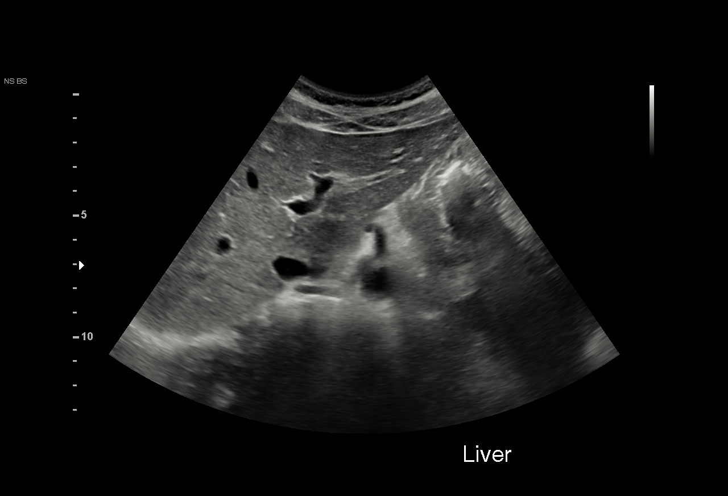
[im 9/42]
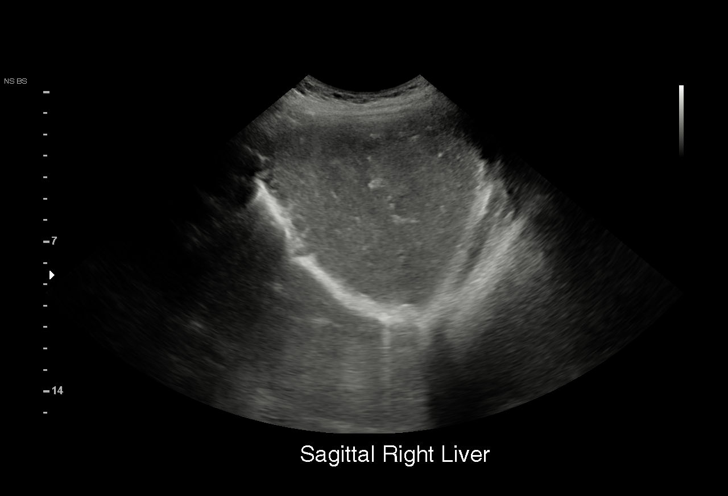
[im 12/42]
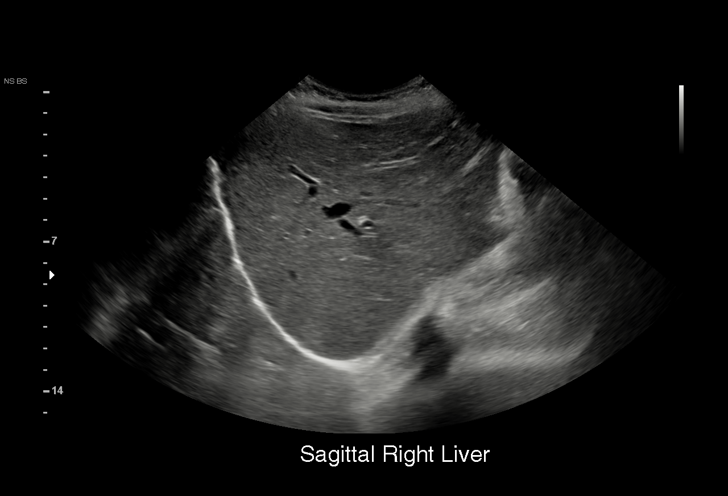
[im 16/42]
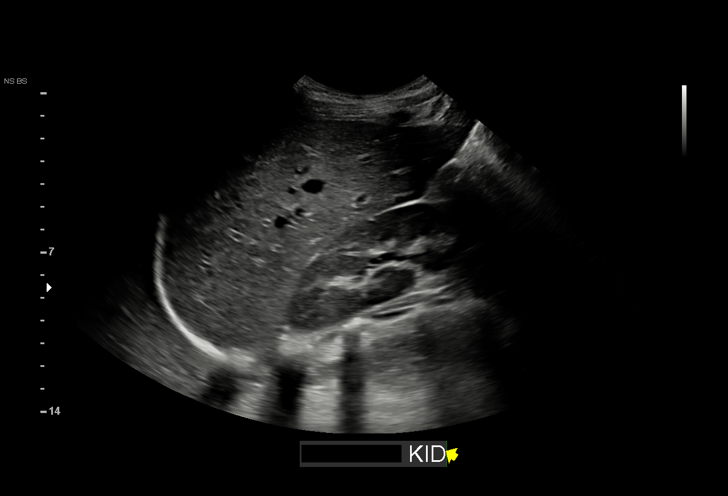
[im 18/42]
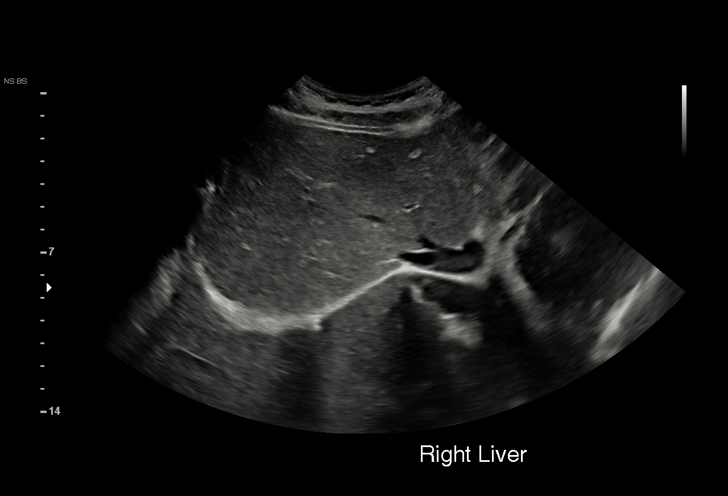
[im 21/42]
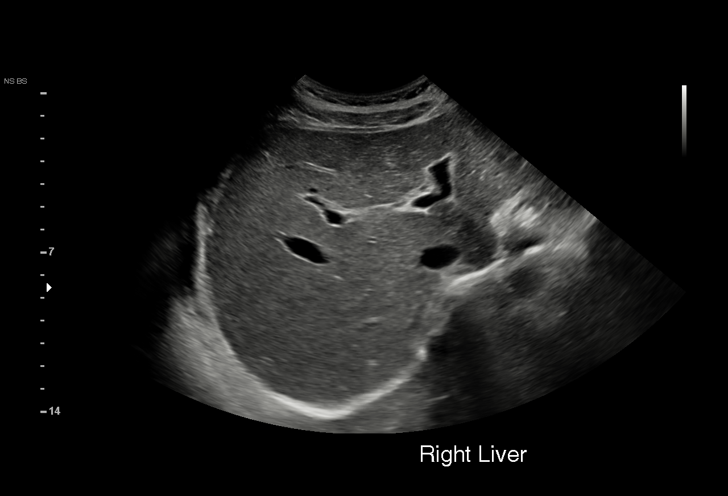
[im 24/42]
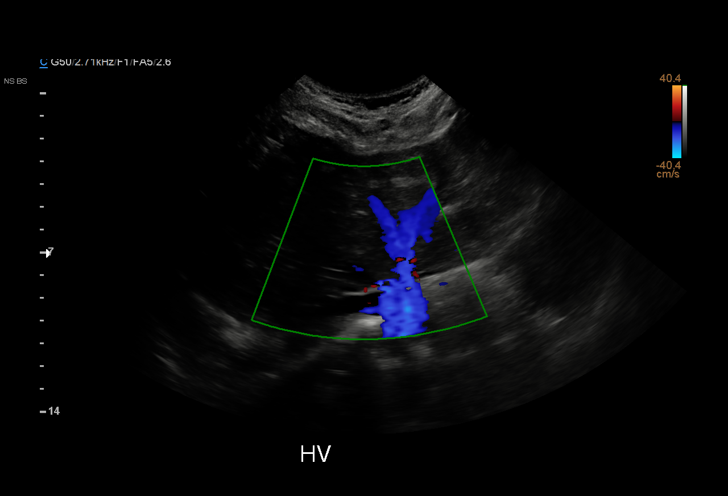
[im 26/42]
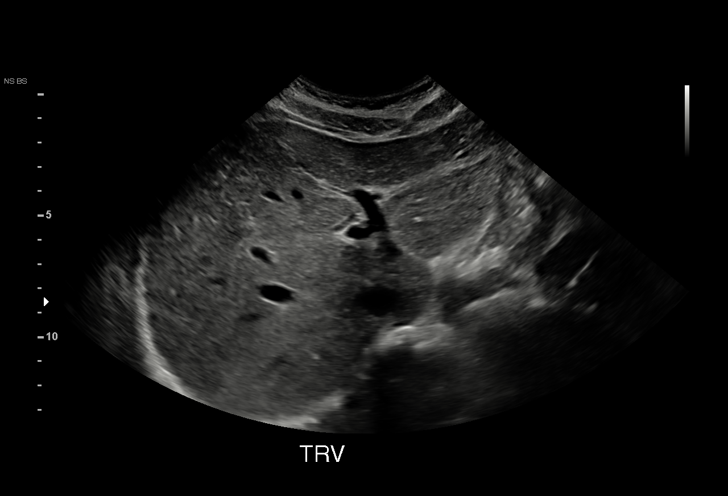
[im 30/42]
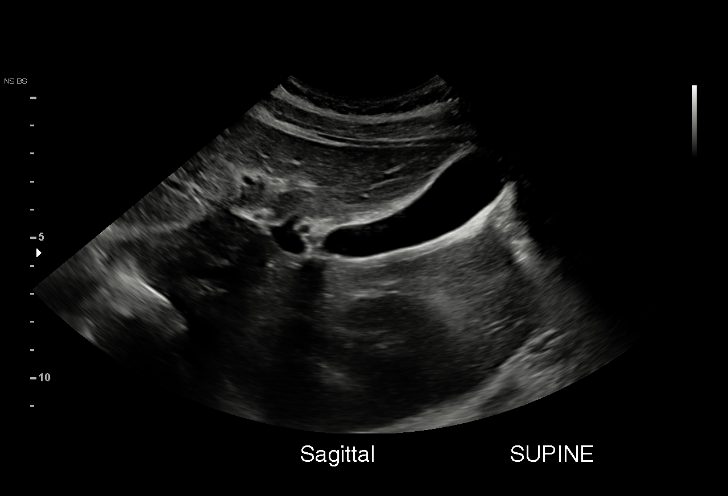
[im 33/42]
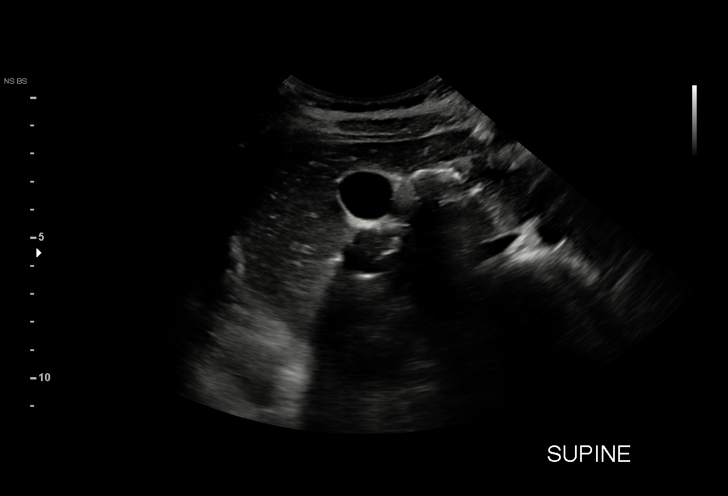
[im 35/42]
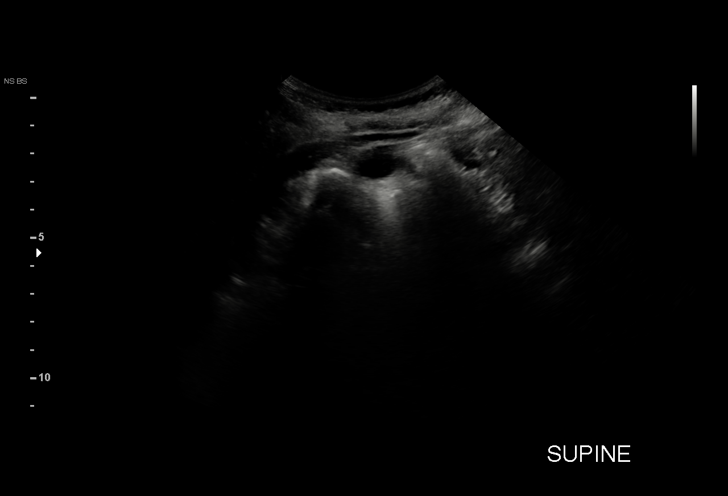
[im 38/42]
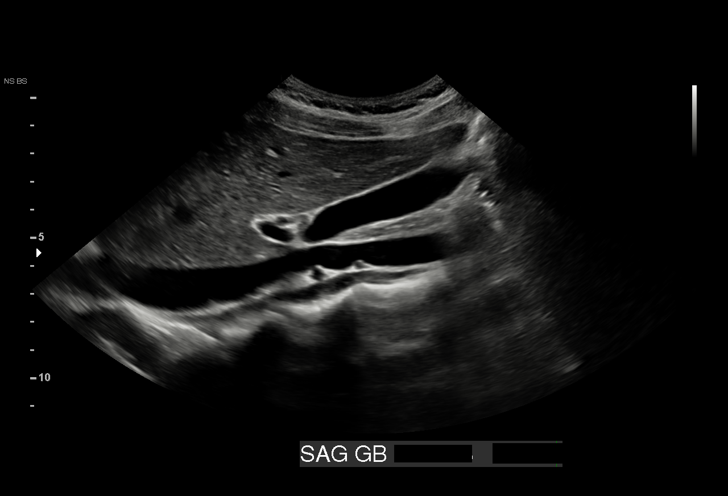
[im 42/42]
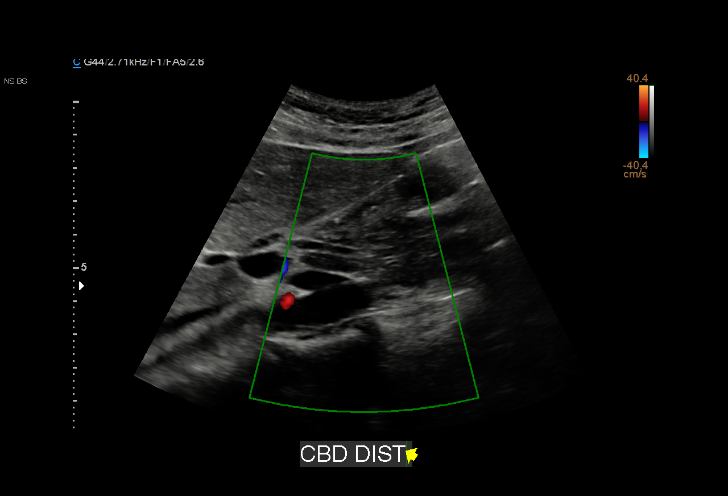

[15 of 25 positions shown; findings below may reference images not displayed]

FINDINGS: Gallbladder:

No gallstones or wall thickening visualized. No sonographic Murphy
sign noted by sonographer.

Common bile duct:

Diameter: 2 mm

Liver:

No focal lesion identified. Within normal limits in parenchymal
echogenicity. Portal vein is patent on color Doppler imaging with
normal direction of blood flow towards the liver.

Other: None.
IMPRESSION: Negative right upper quadrant ultrasound.

## 2022-10-06 ENCOUNTER — Telehealth (INDEPENDENT_AMBULATORY_CARE_PROVIDER_SITE_OTHER): Payer: Self-pay

## 2022-10-06 DIAGNOSIS — O0990 Supervision of high risk pregnancy, unspecified, unspecified trimester: Secondary | ICD-10-CM | POA: Insufficient documentation

## 2022-10-06 DIAGNOSIS — O0992 Supervision of high risk pregnancy, unspecified, second trimester: Secondary | ICD-10-CM

## 2022-10-06 DIAGNOSIS — Z3A23 23 weeks gestation of pregnancy: Secondary | ICD-10-CM

## 2022-10-06 NOTE — Progress Notes (Signed)
New OB Intake  I connected with Katie Baldwin  on 10/06/22 at 10:15 AM EDT by telephone Video Visit and verified that I am speaking with the correct person using two identifiers. Nurse is located at [facility] and pt is located at home.  I discussed the limitations, risks, security and privacy concerns of performing an evaluation and management service by telephone and the availability of in person appointments. I also discussed with the patient that there may be a patient responsible charge related to this service. The patient expressed understanding and agreed to proceed.  I explained I am completing New OB Intake today. We discussed EDD of 01/28/2023, by Last Menstrual Period. Pt is G2P1001. I reviewed her allergies, medications and Medical/Surgical/OB history.    Patient Active Problem List   Diagnosis Date Noted   Supervision of high risk pregnancy, antepartum 10/06/2022   Normal labor 07/29/2021   Gestational thrombocytopenia (HCC) 07/08/2021   Anemia of mother in pregnancy, antepartum 01/25/2021   Encounter for supervision of normal first pregnancy 01/18/2021    Concerns addressed today  Delivery Plans Plans to deliver at [facility]. Discussed the nature of our practice with multiple providers including residents and students. Due to the size of the practice, the delivering provider may not be the same as those providing prenatal care.   Patient is not interested in water birth. Offered upcoming OB visit with CNM to discuss further.  MyChart/Babyscripts MyChart access verified. I explained pt will have some visits in office and some virtually. Babyscripts instructions given and order placed. Patient verifies receipt of registration text/e-mail. Account successfully created and app downloaded.  Blood Pressure Cuff/Weight Scale Patient is self-pay; explained patient will be given BP cuff at first prenatal appt. Explained after first prenatal appt pt will check weekly and document in  Babyscripts. Patient does have weight scale.  Anatomy US Explained first scheduled [place] will be around 19 weeks. Anatomy US scheduled for 10/20/22 at 0930a.  Is patient a CenteringPregnancy candidate?  Will offer at [facility]   Is patient a Mom+Baby Combined Care candidate?  Not a candidate   If accepted, confirm patient does not intend to move from the area for at least 12 months, then notify Mom+Baby staff  Interested in [place]? If yes, send referral and doula dot phrase.    First visit review I reviewed new OB appt with patient. Explained pt will be seen by Dr. Shawnie Pons at first visit. Discussed Avelina Laine genetic screening with patient. Needs Panorama and Horizon.. Routine prenatal labs  needed at [facility].    Last Pap No results found for: "DIAGPAP"  Katie Baldwin, [facility] 10/06/2022  10:45 AM

## 2022-10-06 NOTE — Patient Instructions (Signed)

## 2022-10-13 ENCOUNTER — Encounter: Payer: Self-pay | Admitting: General Practice

## 2022-10-13 ENCOUNTER — Encounter: Payer: Self-pay | Admitting: Family Medicine

## 2022-10-13 DIAGNOSIS — O09899 Supervision of other high risk pregnancies, unspecified trimester: Secondary | ICD-10-CM | POA: Insufficient documentation

## 2022-10-17 ENCOUNTER — Other Ambulatory Visit (HOSPITAL_COMMUNITY)
Admission: RE | Admit: 2022-10-17 | Discharge: 2022-10-17 | Disposition: A | Discharge status: Home / Self Care | Payer: Medicaid Other | Source: Ambulatory Visit | Attending: Family Medicine | Admitting: Family Medicine

## 2022-10-17 ENCOUNTER — Ambulatory Visit (INDEPENDENT_AMBULATORY_CARE_PROVIDER_SITE_OTHER): Payer: Self-pay | Admitting: Family Medicine

## 2022-10-17 ENCOUNTER — Other Ambulatory Visit: Payer: Self-pay

## 2022-10-17 VITALS — BP 115/74 | HR 84 | Wt 139.5 lb

## 2022-10-17 DIAGNOSIS — O0930 Supervision of pregnancy with insufficient antenatal care, unspecified trimester: Secondary | ICD-10-CM | POA: Insufficient documentation

## 2022-10-17 DIAGNOSIS — Z3A25 25 weeks gestation of pregnancy: Secondary | ICD-10-CM | POA: Diagnosis present

## 2022-10-17 DIAGNOSIS — O0990 Supervision of high risk pregnancy, unspecified, unspecified trimester: Secondary | ICD-10-CM | POA: Diagnosis present

## 2022-10-17 DIAGNOSIS — D563 Thalassemia minor: Secondary | ICD-10-CM

## 2022-10-17 DIAGNOSIS — O99012 Anemia complicating pregnancy, second trimester: Secondary | ICD-10-CM

## 2022-10-17 DIAGNOSIS — O99019 Anemia complicating pregnancy, unspecified trimester: Secondary | ICD-10-CM

## 2022-10-17 DIAGNOSIS — O09892 Supervision of other high risk pregnancies, second trimester: Secondary | ICD-10-CM

## 2022-10-17 DIAGNOSIS — O0992 Supervision of high risk pregnancy, unspecified, second trimester: Secondary | ICD-10-CM

## 2022-10-17 NOTE — Patient Instructions (Signed)

## 2022-10-17 NOTE — Progress Notes (Addendum)
Subjective:   Katie Baldwin is a 22 y.o. G2P1001 at [redacted]w[redacted]d by LMP being seen today for her first obstetrical visit.  Her obstetrical history is significant for  short interval pregnancy and silent alpha thalassemia carrier . Patient does not intend to breast feed. Pregnancy history fully reviewed.  Patient reports no bleeding, no contractions, no leaking, and round ligament pain.  Reports +FM .  HISTORY: OB History  Gravida Para Term Preterm AB Living  2 1 1  0 0 1  SAB IAB Ectopic Multiple Live Births  0 0 0 0 1    # Outcome Date GA Lbr Len/2nd Weight Sex Type Anes PTL Lv  2 Current           1 Term 07/29/21 [redacted]w[redacted]d 10:00 / 00:22 7 lb 15 oz (3.6 kg) F Vag-Spont EPI  LIV     Birth Comments: wdl     Name: Schwer,GIRL Kennede     Apgar1: 8  Apgar5: 9   Past Medical History:  Diagnosis Date   Anemia    Medical history non-contributory    Past Surgical History:  Procedure Laterality Date   NO PAST SURGERIES     Family History  Problem Relation Age of Onset   Healthy Mother    Asthma Brother    Asthma Maternal Grandmother    Social History   Tobacco Use   Smoking status: Never   Smokeless tobacco: Never  Vaping Use   Vaping status: Never Used  Substance Use Topics   Alcohol use: Never   Drug use: Not Currently    Types: Marijuana    Comment: last used marijuana 11/26/2020   No Known Allergies Current Outpatient Medications on File Prior to Visit  Medication Sig Dispense Refill   Prenatal Vit-Fe Fumarate-FA (MULTIVITAMIN-PRENATAL) 27-0.8 MG TABS tablet Take 1 tablet by mouth daily at 12 noon.     famotidine (PEPCID) 20 MG tablet Take 1 tablet (20 mg total) by mouth 2 (two) times daily. (Patient not taking: Reported on 10/06/2022) 60 tablet 3   metoCLOPramide (REGLAN) 10 MG tablet Take 1 tablet (10 mg total) by mouth every 6 (six) hours. (Patient not taking: Reported on 10/06/2022) 60 tablet 3   No current facility-administered medications on file prior to  visit.     Exam   Vitals:   10/17/22 0919  BP: 115/74  Pulse: 84  Weight: 139 lb 8 oz (63.3 kg)   Fetal Heart Rate (bpm): 139  Uterus:  Fundal Height: 25 cm  System: General: well-developed, well-nourished female in no acute distress   Skin: normal coloration and turgor, no rashes   Neurologic: oriented, normal, negative, normal mood   Extremities: normal strength, tone, and muscle mass, ROM of all joints is normal   HEENT PERRLA, extraocular movement intact and sclera clear, anicteric   Mouth/Teeth mucous membranes moist, pharynx normal without lesions and dental hygiene good   Neck supple and no masses   Cardiovascular: regular rate and rhythm   Respiratory:  no respiratory distress, normal breath sounds   Abdomen: soft, non-tender; bowel sounds normal; no masses,  no organomegaly     Assessment:   Pregnancy: G2P1001 Patient Active Problem List   Diagnosis Date Noted   Alpha thalassemia silent carrier 10/17/2022   Short interval between pregnancies affecting pregnancy, antepartum 10/13/2022   Supervision of high risk pregnancy, antepartum 10/06/2022     Plan:  1. Supervision of high risk pregnancy, antepartum 2. [redacted] weeks gestation of pregnancy -  CBC/D/Plt+RPR+Rh+ABO+RubIgG... - Hemoglobin A1c - PANORAMA PRENATAL TEST - Culture, OB Urine - GC/Chlamydia probe amp ([place])not at [facility] - Patient states that though she would appreciate centering she is worried that she won't be able to make all the appointments, especially now with a 1yo at home  3. Alpha thalassemia silent carrier FOB tested and per patient reports negative carrier status   4. Short interval between pregnancies complicating pregnancy in second trimester, antepartum Denies any preterm labor sequela today  [place] scheduled 8/15   Initial labs drawn. Continue prenatal vitamins. Genetic Screening discussed, NIPS: ordered. Previous horizon so not ordered today. Ultrasound discussed; fetal anatomic  survey:  scheduled 8/15 . Problem list reviewed and updated. The nature of  - [facility] with multiple MDs and other Advanced Practice Providers was explained to patient; also emphasized that residents, students are part of our team. Routine obstetric precautions reviewed. Return in about 3 weeks (around 11/07/2022) for LROB, 28week labs including 2hr GTT .  Mittie Bodo, MD Family Medicine - Obstetrics Fellow

## 2022-10-18 ENCOUNTER — Encounter: Payer: Self-pay | Admitting: Family Medicine

## 2022-10-18 DIAGNOSIS — Z3A25 25 weeks gestation of pregnancy: Secondary | ICD-10-CM | POA: Insufficient documentation

## 2022-10-18 NOTE — Addendum Note (Signed)
Addended by: Mittie Bodo on: 10/18/2022 08:11 AM   Modules accepted: Orders

## 2022-10-20 ENCOUNTER — Other Ambulatory Visit: Payer: Self-pay

## 2022-10-20 ENCOUNTER — Ambulatory Visit: Payer: Self-pay

## 2022-10-20 DIAGNOSIS — O0932 Supervision of pregnancy with insufficient antenatal care, second trimester: Secondary | ICD-10-CM

## 2022-10-25 ENCOUNTER — Other Ambulatory Visit: Payer: Self-pay | Admitting: General Practice

## 2022-10-25 DIAGNOSIS — O0990 Supervision of high risk pregnancy, unspecified, unspecified trimester: Secondary | ICD-10-CM

## 2022-10-25 LAB — PANORAMA PRENATAL TEST FULL PANEL:PANORAMA TEST PLUS 5 ADDITIONAL MICRODELETIONS

## 2022-10-25 MED ORDER — VITAFOL ULTRA 29-0.6-0.4-200 MG PO CAPS
1.0000 | ORAL_CAPSULE | Freq: Every day | ORAL | 11 refills | Status: DC
Start: 2022-10-25 — End: 2023-03-14

## 2022-10-27 ENCOUNTER — Encounter: Payer: Self-pay | Admitting: Family Medicine

## 2022-10-27 ENCOUNTER — Telehealth: Payer: Self-pay | Admitting: Pharmacy Technician

## 2022-10-27 NOTE — Telephone Encounter (Signed)
Dr. Judd Lien, [facility] note:  Patient will be scheduled as soon as possible.  Auth Submission: NO AUTH NEEDED Site of care: Site of care: [facility] Payer: [employer] [place] Medication & CPT/J Code(s) submitted: Venofer (Iron Sucrose) J1756 Route of submission (phone, fax, portal):  Phone # Fax # Auth type: Buy/Bill PB Units/visits requested: 5 Reference number:  Approval from: 10/27/22 to 01/27/23

## 2022-10-31 ENCOUNTER — Ambulatory Visit (INDEPENDENT_AMBULATORY_CARE_PROVIDER_SITE_OTHER): Payer: Medicaid Other

## 2022-10-31 VITALS — BP 109/68 | HR 81 | Temp 98.4°F | Resp 16 | Ht 63.0 in | Wt 139.0 lb

## 2022-10-31 DIAGNOSIS — Z3A27 27 weeks gestation of pregnancy: Secondary | ICD-10-CM

## 2022-10-31 DIAGNOSIS — O99019 Anemia complicating pregnancy, unspecified trimester: Secondary | ICD-10-CM

## 2022-10-31 DIAGNOSIS — Z3A25 25 weeks gestation of pregnancy: Secondary | ICD-10-CM

## 2022-10-31 DIAGNOSIS — O99012 Anemia complicating pregnancy, second trimester: Secondary | ICD-10-CM | POA: Diagnosis not present

## 2022-10-31 MED ORDER — SODIUM CHLORIDE 0.9 % IV SOLN
200.0000 mg | Freq: Once | INTRAVENOUS | Status: AC
  Administered 2022-10-31: 200 mg via INTRAVENOUS
  Filled 2022-10-31: qty 10

## 2022-10-31 MED ORDER — DIPHENHYDRAMINE HCL 25 MG PO CAPS
25.0000 mg | ORAL_CAPSULE | Freq: Once | ORAL | Status: AC
  Administered 2022-10-31: 25 mg via ORAL
  Filled 2022-10-31: qty 1

## 2022-10-31 MED ORDER — ACETAMINOPHEN 325 MG PO TABS
650.0000 mg | ORAL_TABLET | Freq: Once | ORAL | Status: AC
  Administered 2022-10-31: 650 mg via ORAL
  Filled 2022-10-31: qty 2

## 2022-10-31 NOTE — Progress Notes (Signed)
Diagnosis: Iron Deficiency Anemia  Provider:  Chilton Greathouse MD  Procedure: IV Infusion  IV Type: Peripheral, IV Location: R Antecubital  Venofer (Iron Sucrose), Dose: 200 mg  Infusion Start Time: 0951  Infusion Stop Time: 1010  Post Infusion IV Care: Observation period completed. PIV removed  Discharge: Condition: Good, Destination: Home . AVS Declined  Performed by:  Rico Ala, LPN

## 2022-11-02 ENCOUNTER — Ambulatory Visit: Payer: Medicaid Other

## 2022-11-02 MED ORDER — ACETAMINOPHEN 325 MG PO TABS: 650.0000 mg | ORAL_TABLET | Freq: Once | ORAL | Status: DC

## 2022-11-02 MED ORDER — DIPHENHYDRAMINE HCL 25 MG PO CAPS: 25.0000 mg | ORAL_CAPSULE | Freq: Once | ORAL | Status: DC

## 2022-11-02 MED ORDER — SODIUM CHLORIDE 0.9 % IV SOLN
200.0000 mg | Freq: Once | INTRAVENOUS | Status: DC
  Filled 2022-11-02: qty 10

## 2022-11-03 ENCOUNTER — Other Ambulatory Visit: Payer: Self-pay

## 2022-11-03 ENCOUNTER — Ambulatory Visit: Payer: Medicaid Other | Attending: Obstetrics and Gynecology

## 2022-11-03 DIAGNOSIS — O0990 Supervision of high risk pregnancy, unspecified, unspecified trimester: Secondary | ICD-10-CM

## 2022-11-04 ENCOUNTER — Ambulatory Visit (INDEPENDENT_AMBULATORY_CARE_PROVIDER_SITE_OTHER): Payer: Medicaid Other

## 2022-11-04 VITALS — BP 115/71 | HR 79 | Temp 98.3°F | Resp 16 | Ht 63.0 in | Wt 139.6 lb

## 2022-11-04 DIAGNOSIS — O99013 Anemia complicating pregnancy, third trimester: Secondary | ICD-10-CM

## 2022-11-04 DIAGNOSIS — Z3A27 27 weeks gestation of pregnancy: Secondary | ICD-10-CM | POA: Diagnosis not present

## 2022-11-04 DIAGNOSIS — O99019 Anemia complicating pregnancy, unspecified trimester: Secondary | ICD-10-CM

## 2022-11-04 DIAGNOSIS — D508 Other iron deficiency anemias: Secondary | ICD-10-CM | POA: Diagnosis not present

## 2022-11-04 DIAGNOSIS — Z3A25 25 weeks gestation of pregnancy: Secondary | ICD-10-CM

## 2022-11-04 MED ORDER — DIPHENHYDRAMINE HCL 25 MG PO CAPS
25.0000 mg | ORAL_CAPSULE | Freq: Once | ORAL | Status: AC
  Administered 2022-11-04: 25 mg via ORAL
  Filled 2022-11-04: qty 1

## 2022-11-04 MED ORDER — SODIUM CHLORIDE 0.9 % IV SOLN
200.0000 mg | Freq: Once | INTRAVENOUS | Status: AC
  Administered 2022-11-04: 200 mg via INTRAVENOUS
  Filled 2022-11-04: qty 10

## 2022-11-04 MED ORDER — ACETAMINOPHEN 325 MG PO TABS
650.0000 mg | ORAL_TABLET | Freq: Once | ORAL | Status: AC
  Administered 2022-11-04: 650 mg via ORAL
  Filled 2022-11-04: qty 2

## 2022-11-04 NOTE — Progress Notes (Signed)
Diagnosis: Iron Deficiency Anemia  Provider:  Chilton Greathouse MD  Procedure: IV Infusion  IV Type: Peripheral, IV Location: R Forearm  Venofer (Iron Sucrose), Dose: 200 mg  Infusion Start Time: 0956  Infusion Stop Time: 1013  Post Infusion IV Care: Observation period completed and Peripheral IV Discontinued  Discharge: Condition: Good, Destination: Home . AVS Declined  Performed by:  Adriana Mccallum, RN

## 2022-11-08 ENCOUNTER — Other Ambulatory Visit: Payer: Self-pay

## 2022-11-08 ENCOUNTER — Encounter: Payer: Self-pay | Admitting: Certified Nurse Midwife

## 2022-11-08 ENCOUNTER — Ambulatory Visit (INDEPENDENT_AMBULATORY_CARE_PROVIDER_SITE_OTHER): Payer: Medicaid Other

## 2022-11-08 VITALS — BP 97/58 | HR 89 | Temp 98.2°F | Resp 18 | Ht 63.0 in | Wt 140.8 lb

## 2022-11-08 DIAGNOSIS — Z3A28 28 weeks gestation of pregnancy: Secondary | ICD-10-CM

## 2022-11-08 DIAGNOSIS — Z3A25 25 weeks gestation of pregnancy: Secondary | ICD-10-CM

## 2022-11-08 DIAGNOSIS — Z3492 Encounter for supervision of normal pregnancy, unspecified, second trimester: Secondary | ICD-10-CM

## 2022-11-08 DIAGNOSIS — D508 Other iron deficiency anemias: Secondary | ICD-10-CM | POA: Diagnosis not present

## 2022-11-08 DIAGNOSIS — O99019 Anemia complicating pregnancy, unspecified trimester: Secondary | ICD-10-CM

## 2022-11-08 DIAGNOSIS — O0932 Supervision of pregnancy with insufficient antenatal care, second trimester: Secondary | ICD-10-CM

## 2022-11-08 DIAGNOSIS — O99013 Anemia complicating pregnancy, third trimester: Secondary | ICD-10-CM | POA: Diagnosis not present

## 2022-11-08 MED ORDER — SODIUM CHLORIDE 0.9 % IV SOLN
200.0000 mg | Freq: Once | INTRAVENOUS | Status: AC
  Administered 2022-11-08: 200 mg via INTRAVENOUS
  Filled 2022-11-08: qty 10

## 2022-11-08 MED ORDER — ACETAMINOPHEN 325 MG PO TABS: 650.0000 mg | ORAL_TABLET | Freq: Once | ORAL | Status: DC

## 2022-11-08 MED ORDER — DIPHENHYDRAMINE HCL 25 MG PO CAPS: 25.0000 mg | ORAL_CAPSULE | Freq: Once | ORAL | Status: DC

## 2022-11-08 NOTE — Progress Notes (Signed)
Diagnosis: Iron Deficiency Anemia  Provider:  Chilton Greathouse MD  Procedure: IV Infusion  IV Type: Peripheral, IV Location: R Antecubital  Venofer (Iron Sucrose), Dose: 200 mg  Infusion Start Time: 1444  Infusion Stop Time: 1505  Post Infusion IV Care: Patient declined observation and Peripheral IV Discontinued  Discharge: Condition: Good, Destination: Home . AVS Declined  Performed by:  Adriana Mccallum, RN

## 2022-11-11 ENCOUNTER — Other Ambulatory Visit: Payer: Self-pay

## 2022-11-11 ENCOUNTER — Ambulatory Visit (INDEPENDENT_AMBULATORY_CARE_PROVIDER_SITE_OTHER): Payer: Medicaid Other | Admitting: Advanced Practice Midwife

## 2022-11-11 ENCOUNTER — Ambulatory Visit (INDEPENDENT_AMBULATORY_CARE_PROVIDER_SITE_OTHER): Payer: Medicaid Other

## 2022-11-11 VITALS — BP 98/53 | HR 85 | Temp 97.9°F | Resp 20 | Ht 63.0 in | Wt 139.6 lb

## 2022-11-11 VITALS — BP 113/63 | HR 94 | Wt 139.5 lb

## 2022-11-11 DIAGNOSIS — Z23 Encounter for immunization: Secondary | ICD-10-CM

## 2022-11-11 DIAGNOSIS — O99013 Anemia complicating pregnancy, third trimester: Secondary | ICD-10-CM

## 2022-11-11 DIAGNOSIS — D508 Other iron deficiency anemias: Secondary | ICD-10-CM | POA: Diagnosis not present

## 2022-11-11 DIAGNOSIS — D509 Iron deficiency anemia, unspecified: Secondary | ICD-10-CM

## 2022-11-11 DIAGNOSIS — O0990 Supervision of high risk pregnancy, unspecified, unspecified trimester: Secondary | ICD-10-CM

## 2022-11-11 DIAGNOSIS — Z3A25 25 weeks gestation of pregnancy: Secondary | ICD-10-CM

## 2022-11-11 DIAGNOSIS — O99019 Anemia complicating pregnancy, unspecified trimester: Secondary | ICD-10-CM | POA: Diagnosis not present

## 2022-11-11 DIAGNOSIS — O09899 Supervision of other high risk pregnancies, unspecified trimester: Secondary | ICD-10-CM

## 2022-11-11 DIAGNOSIS — J111 Influenza due to unidentified influenza virus with other respiratory manifestations: Secondary | ICD-10-CM | POA: Diagnosis not present

## 2022-11-11 DIAGNOSIS — Z3A28 28 weeks gestation of pregnancy: Secondary | ICD-10-CM | POA: Diagnosis not present

## 2022-11-11 DIAGNOSIS — O0933 Supervision of pregnancy with insufficient antenatal care, third trimester: Secondary | ICD-10-CM

## 2022-11-11 DIAGNOSIS — D563 Thalassemia minor: Secondary | ICD-10-CM

## 2022-11-11 MED ORDER — DIPHENHYDRAMINE HCL 25 MG PO CAPS: 25.0000 mg | ORAL_CAPSULE | Freq: Once | ORAL | Status: DC

## 2022-11-11 MED ORDER — ACETAMINOPHEN 325 MG PO TABS: 650.0000 mg | ORAL_TABLET | Freq: Once | ORAL | Status: DC

## 2022-11-11 MED ORDER — SODIUM CHLORIDE 0.9 % IV SOLN
200.0000 mg | Freq: Once | INTRAVENOUS | Status: AC
  Administered 2022-11-11: 200 mg via INTRAVENOUS
  Filled 2022-11-11: qty 10

## 2022-11-11 NOTE — Progress Notes (Signed)
Diagnosis: Iron Deficiency Anemia  Provider:  Chilton Greathouse MD  Procedure: IV Infusion  IV Type: Peripheral, IV Location: R Antecubital  Venofer (Iron Sucrose), Dose: 200 mg  Infusion Start Time: 1145  Infusion Stop Time: 1201  Post Infusion IV Care: Patient declined observation and Peripheral IV Discontinued  Discharge: Condition: Good, Destination: Home . AVS Declined  Performed by:  Rico Ala, LPN

## 2022-11-11 NOTE — Patient Instructions (Signed)

## 2022-11-11 NOTE — Progress Notes (Addendum)
   PRENATAL VISIT NOTE  Subjective:  Katie Baldwin is a 22 y.o. G2P1001 at [redacted]w[redacted]d being seen today for ongoing prenatal care.  She is currently monitored for the following issues for this high-risk pregnancy and has Anemia of mother in pregnancy, antepartum; Supervision of high risk pregnancy, antepartum; Short interval between pregnancies affecting pregnancy, antepartum; Alpha thalassemia silent carrier; Late prenatal care affecting pregnancy; and [redacted] weeks gestation of pregnancy on their problem list.  Patient reports no bleeding, no contractions, no cramping, and no leaking.  Contractions: Not present. Vag. Bleeding: None.  Movement: Present. Denies leaking of fluid.   The following portions of the patient's history were reviewed and updated as appropriate: allergies, current medications, past family history, past medical history, past social history, past surgical history and problem list.   Objective:   Vitals:   11/11/22 0854  BP: 113/63  Pulse: 94  Weight: 63.3 kg    Fetal Status: Fetal Heart Rate (bpm): 143 Fundal Height: 28 cm Movement: Present     General:  Alert, oriented and cooperative. Patient is in no acute distress.  Skin: Skin is warm and dry. No rash noted.   Cardiovascular: Normal heart rate noted  Respiratory: Normal respiratory effort, no problems with respiration noted  Abdomen: Soft, gravid, appropriate for gestational age.  Pain/Pressure: Absent     Pelvic: Cervical exam deferred        Extremities: Normal range of motion.  Edema: None  Mental Status: Normal mood and affect. Normal behavior. Normal judgment and thought content.   Assessment and Plan:  Pregnancy: G2P1001 at [redacted]w[redacted]d 1. Anemia of mother in pregnancy, antepartum Hgb 7.8 10/17/22 Patient had 3 prior infusions, scheduled for one infusion today at 1130, and one more to come. Patient reports not feeling any improvement currently but has no symptoms currently  - Anemia Profile B pending   2.  Supervision of high risk pregnancy, antepartum 28 week labs and TdaP today   3. Short interval between pregnancies affecting pregnancy, antepartum Previous vaginal delivery 06/2021  4. Alpha thalassemia silent carrier Partner negative   5. Late prenatal care affecting pregnancy in third trimester   6. [redacted] weeks gestation of pregnancy  Tdap and influenza given 11/11/2022   7. Maternal iron deficiency anemia affecting pregnancy in third trimester, antepartum - Anemia Profile B pending  Preterm labor symptoms and general obstetric precautions including but not limited to vaginal bleeding, contractions, leaking of fluid and fetal movement were reviewed in detail with the patient. Please refer to After Visit Summary for other counseling recommendations.   No follow-ups on file.  Future Appointments  Date Time Provider Department Center  11/14/2022 10:00 AM [facility] None  12/08/2022 11:15 AM Hermina Staggers, MD [facility]  12/08/2022 12:15 PM [facility] NURSE [facility]  12/08/2022 12:30 PM [facility] US2 [facility]    Cleda Mccreedy, Student-MidWife

## 2022-11-13 LAB — ANEMIA PROFILE B
Basophils Absolute: 0 10*3/uL (ref 0.0–0.2)
Basos: 0 %
EOS (ABSOLUTE): 0.1 10*3/uL (ref 0.0–0.4)
Eos: 1 %
Ferritin: 187 ng/mL — ABNORMAL HIGH (ref 15–150)
Folate: 8.2 ng/mL (ref >3.0)
Hematocrit: 29.6 % — ABNORMAL LOW (ref 34.0–46.6)
Hemoglobin: 9 g/dL — ABNORMAL LOW (ref 11.1–15.9)
Immature Grans (Abs): 0.1 10*3/uL (ref 0.0–0.1)
Immature Granulocytes: 2 %
Iron Saturation: 15 % (ref 15–55)
Iron: 51 ug/dL (ref 27–159)
Lymphocytes Absolute: 0.8 10*3/uL (ref 0.7–3.1)
Lymphs: 15 %
MCH: 24.2 pg — ABNORMAL LOW (ref 26.6–33.0)
MCHC: 30.4 g/dL — ABNORMAL LOW (ref 31.5–35.7)
MCV: 80 fL (ref 79–97)
Monocytes Absolute: 0.4 10*3/uL (ref 0.1–0.9)
Monocytes: 8 %
Neutrophils Absolute: 4.1 10*3/uL (ref 1.4–7.0)
Neutrophils: 74 %
Platelets: 108 10*3/uL — ABNORMAL LOW (ref 150–450)
RBC: 3.72 x10E6/uL — ABNORMAL LOW (ref 3.77–5.28)
RDW: 15.4 % (ref 11.7–15.4)
Retic Ct Pct: 3.7 % — ABNORMAL HIGH (ref 0.6–2.6)
Total Iron Binding Capacity: 351 ug/dL (ref 250–450)
UIBC: 300 ug/dL (ref 131–425)
Vitamin B-12: 284 pg/mL (ref 232–1245)
WBC: 5.5 10*3/uL (ref 3.4–10.8)

## 2022-11-13 LAB — GLUCOSE TOLERANCE, 2 HOURS W/ 1HR
Glucose, 1 hour: 119 mg/dL (ref 70–179)
Glucose, 2 hour: 95 mg/dL (ref 70–152)
Glucose, Fasting: 80 mg/dL (ref 70–91)

## 2022-11-13 LAB — CBC
Hematocrit: 29.3 % — ABNORMAL LOW (ref 34.0–46.6)
Hemoglobin: 9.2 g/dL — ABNORMAL LOW (ref 11.1–15.9)
MCH: 25 pg — ABNORMAL LOW (ref 26.6–33.0)
MCHC: 31.4 g/dL — ABNORMAL LOW (ref 31.5–35.7)
MCV: 80 fL (ref 79–97)
Platelets: 105 10*3/uL — ABNORMAL LOW (ref 150–450)
RBC: 3.68 x10E6/uL — ABNORMAL LOW (ref 3.77–5.28)
RDW: 15.5 % — ABNORMAL HIGH (ref 11.7–15.4)
WBC: 5 10*3/uL (ref 3.4–10.8)

## 2022-11-13 LAB — RPR: RPR Ser Ql: NONREACTIVE

## 2022-11-13 LAB — HIV ANTIBODY (ROUTINE TESTING W REFLEX): HIV Screen 4th Generation wRfx: NONREACTIVE

## 2022-11-14 ENCOUNTER — Ambulatory Visit (INDEPENDENT_AMBULATORY_CARE_PROVIDER_SITE_OTHER): Payer: Medicaid Other

## 2022-11-14 VITALS — BP 108/61 | HR 76 | Temp 98.5°F | Resp 16 | Ht 63.0 in | Wt 140.4 lb

## 2022-11-14 DIAGNOSIS — D508 Other iron deficiency anemias: Secondary | ICD-10-CM | POA: Diagnosis not present

## 2022-11-14 DIAGNOSIS — Z3A25 25 weeks gestation of pregnancy: Secondary | ICD-10-CM

## 2022-11-14 DIAGNOSIS — O99013 Anemia complicating pregnancy, third trimester: Secondary | ICD-10-CM | POA: Diagnosis not present

## 2022-11-14 DIAGNOSIS — Z3A29 29 weeks gestation of pregnancy: Secondary | ICD-10-CM

## 2022-11-14 DIAGNOSIS — O99019 Anemia complicating pregnancy, unspecified trimester: Secondary | ICD-10-CM

## 2022-11-14 MED ORDER — ACETAMINOPHEN 325 MG PO TABS: 650.0000 mg | ORAL_TABLET | Freq: Once | ORAL | Status: DC

## 2022-11-14 MED ORDER — DIPHENHYDRAMINE HCL 25 MG PO CAPS: 25.0000 mg | ORAL_CAPSULE | Freq: Once | ORAL | Status: DC

## 2022-11-14 MED ORDER — SODIUM CHLORIDE 0.9 % IV SOLN
200.0000 mg | Freq: Once | INTRAVENOUS | Status: AC
  Administered 2022-11-14: 200 mg via INTRAVENOUS
  Filled 2022-11-14: qty 10

## 2022-11-14 NOTE — Progress Notes (Signed)
Diagnosis: Iron Deficiency Anemia  Provider:  Chilton Greathouse MD  Procedure: IV Infusion  IV Type: Peripheral, IV Location: R Antecubital  Venofer (Iron Sucrose), Dose: 200 mg  Infusion Start Time: 1011  Infusion Stop Time: 1028  Post Infusion IV Care: Patient declined observation and Peripheral IV Discontinued  Discharge: Condition: Good, Destination: Home . AVS Declined  Performed by:  Rico Ala, LPN

## 2022-11-15 ENCOUNTER — Encounter: Payer: Self-pay | Admitting: Advanced Practice Midwife

## 2022-11-21 ENCOUNTER — Encounter: Payer: Self-pay | Admitting: Advanced Practice Midwife

## 2022-12-08 ENCOUNTER — Other Ambulatory Visit: Payer: Self-pay | Admitting: *Deleted

## 2022-12-08 ENCOUNTER — Ambulatory Visit (INDEPENDENT_AMBULATORY_CARE_PROVIDER_SITE_OTHER): Payer: Medicaid Other | Admitting: Obstetrics and Gynecology

## 2022-12-08 ENCOUNTER — Other Ambulatory Visit: Payer: Self-pay

## 2022-12-08 ENCOUNTER — Ambulatory Visit: Payer: Medicaid Other | Admitting: *Deleted

## 2022-12-08 ENCOUNTER — Encounter: Payer: Self-pay | Admitting: Obstetrics and Gynecology

## 2022-12-08 ENCOUNTER — Ambulatory Visit: Payer: Medicaid Other | Attending: Family Medicine

## 2022-12-08 VITALS — BP 116/72 | HR 70

## 2022-12-08 VITALS — BP 121/77 | HR 83 | Wt 140.2 lb

## 2022-12-08 DIAGNOSIS — D563 Thalassemia minor: Secondary | ICD-10-CM

## 2022-12-08 DIAGNOSIS — O0933 Supervision of pregnancy with insufficient antenatal care, third trimester: Secondary | ICD-10-CM | POA: Diagnosis not present

## 2022-12-08 DIAGNOSIS — O0990 Supervision of high risk pregnancy, unspecified, unspecified trimester: Secondary | ICD-10-CM | POA: Insufficient documentation

## 2022-12-08 DIAGNOSIS — O99013 Anemia complicating pregnancy, third trimester: Secondary | ICD-10-CM | POA: Diagnosis not present

## 2022-12-08 DIAGNOSIS — O09293 Supervision of pregnancy with other poor reproductive or obstetric history, third trimester: Secondary | ICD-10-CM

## 2022-12-08 DIAGNOSIS — O99113 Other diseases of the blood and blood-forming organs and certain disorders involving the immune mechanism complicating pregnancy, third trimester: Secondary | ICD-10-CM | POA: Diagnosis not present

## 2022-12-08 DIAGNOSIS — Z3A32 32 weeks gestation of pregnancy: Secondary | ICD-10-CM

## 2022-12-08 DIAGNOSIS — O0993 Supervision of high risk pregnancy, unspecified, third trimester: Secondary | ICD-10-CM

## 2022-12-08 DIAGNOSIS — D649 Anemia, unspecified: Secondary | ICD-10-CM

## 2022-12-08 DIAGNOSIS — O285 Abnormal chromosomal and genetic finding on antenatal screening of mother: Secondary | ICD-10-CM

## 2022-12-08 DIAGNOSIS — D696 Thrombocytopenia, unspecified: Secondary | ICD-10-CM

## 2022-12-08 DIAGNOSIS — O99019 Anemia complicating pregnancy, unspecified trimester: Secondary | ICD-10-CM

## 2022-12-08 DIAGNOSIS — O09893 Supervision of other high risk pregnancies, third trimester: Secondary | ICD-10-CM

## 2022-12-08 NOTE — Progress Notes (Signed)
Subjective:  Katie Baldwin is a 22 y.o. G2P1001 at [redacted]w[redacted]d being seen today for ongoing prenatal care.  She is currently monitored for the following issues for this low-risk pregnancy and has Anemia of mother in pregnancy, antepartum; Gestational thrombocytopenia (HCC); Supervision of high risk pregnancy, antepartum; Short interval between pregnancies affecting pregnancy, antepartum; Alpha thalassemia silent carrier; and Late prenatal care affecting pregnancy on their problem list.  Patient reports no complaints.  Contractions: Irritability. Vag. Bleeding: None.  Movement: Present. Denies leaking of fluid.   The following portions of the patient's history were reviewed and updated as appropriate: allergies, current medications, past family history, past medical history, past social history, past surgical history and problem list. Problem list updated.  Objective:   Vitals:   12/08/22 1133  BP: 121/77  Pulse: 83  Weight: 140 lb 3.2 oz (63.6 kg)    Fetal Status: Fetal Heart Rate (bpm): 147   Movement: Present     General:  Alert, oriented and cooperative. Patient is in no acute distress.  Skin: Skin is warm and dry. No rash noted.   Cardiovascular: Normal heart rate noted  Respiratory: Normal respiratory effort, no problems with respiration noted  Abdomen: Soft, gravid, appropriate for gestational age. Pain/Pressure: Present     Pelvic:  Cervical exam deferred        Extremities: Normal range of motion.  Edema: None  Mental Status: Normal mood and affect. Normal behavior. Normal judgment and thought content.   Urinalysis:      Assessment and Plan:  Pregnancy: G2P1001 at [redacted]w[redacted]d  1. Supervision of high risk pregnancy, antepartum Stable Growth scan today  2. Benign gestational thrombocytopenia in third trimester (HCC) F/U CBC at 36 weeks  3. Anemia of mother in pregnancy, antepartum S/P Venofor  4. Alpha thalassemia silent carrier FOB referred for testing  Preterm labor  symptoms and general obstetric precautions including but not limited to vaginal bleeding, contractions, leaking of fluid and fetal movement were reviewed in detail with the patient. Please refer to After Visit Summary for other counseling recommendations.  Return in about 1 week (around 12/15/2022) for OB visit, face to face, MD only.   Hermina Staggers, MD

## 2022-12-09 ENCOUNTER — Encounter: Payer: Medicaid Other | Admitting: Obstetrics and Gynecology

## 2022-12-22 LAB — OB RESULTS CONSOLE GBS: GBS: NEGATIVE

## 2022-12-26 ENCOUNTER — Encounter: Payer: Medicaid Other | Admitting: Obstetrics and Gynecology

## 2022-12-29 ENCOUNTER — Telehealth: Payer: Medicaid Other

## 2022-12-29 ENCOUNTER — Telehealth: Payer: Self-pay | Admitting: Family Medicine

## 2022-12-29 ENCOUNTER — Encounter: Payer: Self-pay | Admitting: *Deleted

## 2022-12-29 NOTE — Telephone Encounter (Signed)
Patient called in stating that her mucus plug came out and she is having back pains and stomach pains, they are about 45 ins apart.

## 2022-12-29 NOTE — Telephone Encounter (Signed)
Called pt and she did not answer. I left a voicemail message stating that I will send a secure Mychart message to her regarding her concerns. If she has additional questions, she may send a return message back to [place].

## 2022-12-30 ENCOUNTER — Inpatient Hospital Stay (HOSPITAL_COMMUNITY)
Admission: AD | Admit: 2022-12-30 | Discharge: 2022-12-30 | Disposition: A | Discharge status: Home / Self Care | Payer: Medicaid Other | Attending: Obstetrics & Gynecology | Admitting: Obstetrics & Gynecology

## 2022-12-30 DIAGNOSIS — O219 Vomiting of pregnancy, unspecified: Secondary | ICD-10-CM | POA: Diagnosis not present

## 2022-12-30 DIAGNOSIS — O99113 Other diseases of the blood and blood-forming organs and certain disorders involving the immune mechanism complicating pregnancy, third trimester: Secondary | ICD-10-CM | POA: Diagnosis not present

## 2022-12-30 DIAGNOSIS — D696 Thrombocytopenia, unspecified: Secondary | ICD-10-CM | POA: Insufficient documentation

## 2022-12-30 DIAGNOSIS — O4703 False labor before 37 completed weeks of gestation, third trimester: Secondary | ICD-10-CM | POA: Diagnosis present

## 2022-12-30 DIAGNOSIS — Z3A35 35 weeks gestation of pregnancy: Secondary | ICD-10-CM | POA: Insufficient documentation

## 2022-12-30 DIAGNOSIS — O479 False labor, unspecified: Secondary | ICD-10-CM

## 2022-12-30 DIAGNOSIS — O0990 Supervision of high risk pregnancy, unspecified, unspecified trimester: Secondary | ICD-10-CM

## 2022-12-30 LAB — COMPREHENSIVE METABOLIC PANEL
ALT: 12 U/L (ref 0–44)
AST: 21 U/L (ref 15–41)
Albumin: 2.9 g/dL — ABNORMAL LOW (ref 3.5–5.0)
Alkaline Phosphatase: 86 U/L (ref 38–126)
Anion gap: 10 (ref 5–15)
BUN: <5 mg/dL — ABNORMAL LOW (ref 6–20)
CO2: 23 mmol/L (ref 22–32)
Calcium: 8.8 mg/dL — ABNORMAL LOW (ref 8.9–10.3)
Chloride: 104 mmol/L (ref 98–111)
Creatinine, Ser: 0.57 mg/dL (ref 0.44–1.00)
GFR, Estimated: >60 mL/min (ref >60)
Glucose, Bld: 105 mg/dL — ABNORMAL HIGH (ref 70–99)
Potassium: 3.3 mmol/L — ABNORMAL LOW (ref 3.5–5.1)
Sodium: 137 mmol/L (ref 135–145)
Total Bilirubin: 0.5 mg/dL (ref 0.3–1.2)
Total Protein: 6.4 g/dL — ABNORMAL LOW (ref 6.5–8.1)

## 2022-12-30 LAB — CBC WITH DIFFERENTIAL/PLATELET
Abs Immature Granulocytes: 0.06 10*3/uL (ref 0.00–0.07)
Basophils Absolute: 0 10*3/uL (ref 0.0–0.1)
Basophils Relative: 0 %
Eosinophils Absolute: 0 10*3/uL (ref 0.0–0.5)
Eosinophils Relative: 0 %
HCT: 33.4 % — ABNORMAL LOW (ref 36.0–46.0)
Hemoglobin: 10.7 g/dL — ABNORMAL LOW (ref 12.0–15.0)
Immature Granulocytes: 3 %
Lymphocytes Relative: 28 %
Lymphs Abs: 0.7 10*3/uL (ref 0.7–4.0)
MCH: 25.5 pg — ABNORMAL LOW (ref 26.0–34.0)
MCHC: 32 g/dL (ref 30.0–36.0)
MCV: 79.5 fL — ABNORMAL LOW (ref 80.0–100.0)
Monocytes Absolute: 0.5 10*3/uL (ref 0.1–1.0)
Monocytes Relative: 21 %
Neutro Abs: 1.2 10*3/uL — ABNORMAL LOW (ref 1.7–7.7)
Neutrophils Relative %: 48 %
Platelets: 93 10*3/uL — ABNORMAL LOW (ref 150–400)
RBC: 4.2 MIL/uL (ref 3.87–5.11)
RDW: 17.1 % — ABNORMAL HIGH (ref 11.5–15.5)
WBC: 2.4 10*3/uL — ABNORMAL LOW (ref 4.0–10.5)
nRBC: 0 % (ref 0.0–0.2)

## 2022-12-30 LAB — URINALYSIS, ROUTINE W REFLEX MICROSCOPIC
Bilirubin Urine: NEGATIVE
Glucose, UA: NEGATIVE mg/dL
Hgb urine dipstick: NEGATIVE
Ketones, ur: 80 mg/dL — AB
Leukocytes,Ua: NEGATIVE
Nitrite: NEGATIVE
Protein, ur: 100 mg/dL — AB
Specific Gravity, Urine: 1.027 (ref 1.005–1.030)
pH: 5 (ref 5.0–8.0)

## 2022-12-30 LAB — OB RESULTS CONSOLE GBS: GBS: NEGATIVE

## 2022-12-30 LAB — WET PREP, GENITAL
Clue Cells Wet Prep HPF POC: NONE SEEN
Sperm: NONE SEEN
Trich, Wet Prep: NONE SEEN
WBC, Wet Prep HPF POC: >=10 — AB (ref <10)
Yeast Wet Prep HPF POC: NONE SEEN

## 2022-12-30 LAB — RUPTURE OF MEMBRANE (ROM)PLUS: Rom Plus: NEGATIVE

## 2022-12-30 MED ORDER — HYDROMORPHONE HCL 1 MG/ML IJ SOLN: 1.0000 mg | Freq: Once | INTRAMUSCULAR | Status: DC | PRN

## 2022-12-30 MED ORDER — ONDANSETRON 4 MG PO TBDP
8.0000 mg | ORAL_TABLET | Freq: Once | ORAL | Status: AC
  Administered 2022-12-30: 8 mg via ORAL
  Filled 2022-12-30: qty 2

## 2022-12-30 MED ORDER — ONDANSETRON HCL 4 MG PO TABS: 4.0000 mg | ORAL_TABLET | Freq: Every day | ORAL | 1 refills | Status: DC | PRN

## 2022-12-30 NOTE — MAU Provider Note (Signed)
Irregular Ctx, not tolerating PO    S [name] is a 22 y.o. G69P1001 pregnant female at [redacted]w[redacted]d who presents to MAU today with complaint of inability to keep down PO and subsequent irregular CTX. Reports potentially losing mucus plug and subjective leaking since.  Describes the leaking as clear fluid, not like mucus.   She reports feeling usual health until losing mucus plus 48hrs ago then had stomach cramping discomfort and nausea.  No sick contacts.  No change in urination, however had diarrhea yesterday.   Receives care at [facility] Prenatal records reviewed.  Pertinent items noted in HPI and remainder of comprehensive ROS otherwise negative.   O BP 113/63   Pulse 73   Temp 98.5 F (36.9 C)   Resp 18   LMP 04/23/2022  Physical Exam Vitals and nursing note reviewed. Exam conducted with a chaperone present.  Constitutional:      General: She is not in acute distress.    Appearance: Normal appearance. She is not ill-appearing.  HENT:     Head: Normocephalic and atraumatic.     Right Ear: External ear normal.     Left Ear: External ear normal.     Nose: Nose normal.     Mouth/Throat:     Mouth: Mucous membranes are dry.     Pharynx: Oropharynx is clear.  Eyes:     Extraocular Movements: Extraocular movements intact.     Conjunctiva/sclera: Conjunctivae normal.  Cardiovascular:     Rate and Rhythm: Tachycardia present.  Pulmonary:     Effort: Pulmonary effort is normal. No respiratory distress.  Abdominal:     Comments: gravid  Genitourinary:    General: Normal vulva.     Vagina: No vaginal discharge.     Comments: Cervical exam 1cm/20%/-3 Musculoskeletal:        General: No swelling. Normal range of motion.     Cervical back: Normal range of motion.  Skin:    General: Skin is warm and dry.  Neurological:     Mental Status: She is alert and oriented to person, place, and time. Mental status is at baseline.     Motor: No weakness.     Gait: Gait normal.   Psychiatric:        Mood and Affect: Mood normal.        Behavior: Behavior normal.        Thought Content: Thought content normal.        Judgment: Judgment normal.      MDM: MAU Course:  NST: 120bpm, moderate variability, + accels, no decels, irregular ctx  vs uterine irritability   Cervix unchanged   CBC Hgb 10.7, Plts 108 > 93 CMP K 3.3, LFTs wnl, Cr 0.57  ROM plus negative Wet Prep negative GC collected  GBS culture collected  UA noninfectious, ketones and protein positive     A&P: #35 weeks  #N/V of pregnancy #False Labor  #Thrombocytopenia - stable for d/c, tolerting PO, electrolytes and labs stable, f/u GC and GBS swabs    Discharge from MAU in stable condition with strict / usual precautions Follow up at [facility] as scheduled for ongoing prenatal care  Allergies as of 12/30/2022   No Known Allergies      Medication List     TAKE these medications    IRON SUPPLEMENT PO Take 65 mg by mouth every other day.   multivitamin-prenatal 27-0.8 MG Tabs tablet Take 1 tablet by mouth daily at 12 noon.   ondansetron  4 MG tablet Commonly known as: Zofran Take 1 tablet (4 mg total) by mouth daily as needed for nausea or vomiting.   Vitafol Ultra 29-0.6-0.4-200 MG Caps Take 1 capsule by mouth daily.        Hessie Dibble, MD 12/30/2022 2:02 PM

## 2022-12-30 NOTE — MAU Note (Signed)
.  Katie Baldwin is a 22 y.o. at [redacted]w[redacted]d here in MAU reporting: hs had n/v x2 days has not been able to keep much down. Also c/o irregular ctx. Good fetal movement felt. Stated she lost her mucus plug 2 days ago. Has a little bit of leaking after that.  LMP:  Onset of complaint: 2 days Pain score: 3-4 Vitals:   12/30/22 1126  BP: 111/66  Pulse: (!) 110  Resp: 18  Temp: 98.5 F (36.9 C)     FHT:130 Lab orders placed from triage:  u/a

## 2023-01-01 LAB — CULTURE, BETA STREP (GROUP B ONLY)

## 2023-01-02 LAB — GC/CHLAMYDIA PROBE AMP (~~LOC~~) NOT AT ARMC
Chlamydia: NEGATIVE
Comment: NEGATIVE
Comment: NORMAL
Neisseria Gonorrhea: NEGATIVE

## 2023-01-06 ENCOUNTER — Ambulatory Visit: Payer: Medicaid Other | Attending: Maternal & Fetal Medicine

## 2023-01-27 ENCOUNTER — Ambulatory Visit: Payer: Self-pay

## 2023-01-27 ENCOUNTER — Telehealth: Payer: 59 | Admitting: Family Medicine

## 2023-01-27 DIAGNOSIS — Z3A39 39 weeks gestation of pregnancy: Secondary | ICD-10-CM

## 2023-01-27 NOTE — Progress Notes (Signed)
Pt is advised to call her OB for any concerns regarding her pregnancy. She verbalizes understanding and will call the office. DWB

## 2023-01-27 NOTE — Telephone Encounter (Signed)
  Chief Complaint: Contractions started and then stopped. Symptoms: none at this time - mild nausea Frequency: now Pertinent Negatives: Patient denies loss of fluid, decreased fetal movement, loss of blood. Disposition: [] ED /[] Urgent Care (no appt availability in office) / [] Appointment(In office/virtual)/ []  [facility]/ [] Home Care/ [] Refused Recommended Disposition /[] [facility]/ [x]  Follow-up with PCP Additional Notes: PT is [redacted]w[redacted]d and is concerned that labor has not started. Pt had some fairly painful contractions recently, but then stopped. Baby is moving well. Pt is 1cm dilated. Pt does not have a regular OB. [facility]    Reason for Disposition  [1] Caller has URGENT pregnancy question AND [2] triager unable to answer question  Answer Assessment - Initial Assessment Questions 1. PREGNANCY: "Do you know how many weeks or months pregnant you are?"      39 weeks 6 days 2. EDD: "What date are you expecting to deliver?"     tomorrow 5. MAIN SYMPTOM: "What are you most concerned about?" When did it start?"     Have not started labor 6. VAGINAL BLEEDING: If present, ask: "How bad is it?" (Mild, Moderate or Severe. See Severity definitions).     no 7. ABDOMINAL PAIN: If present, ask: "How bad is it?" (Mild, Moderate or Severe. See Severity definitions).     no 8. VOMITING: If present, ask: "How bad is it?" (Mild, Moderate or Severe. See Severity definitions).     No - some nausea 9. LEG SWELLING (EDEMA): If present, ask: "How bad is it?" (Mild, Moderate or Severe. See Severity definitions).     no 10. FEVER: "Do you have a fever?" If so, ask: "What is it, how was it measured, and when did it start?"        no  Protocols used: Pregnancy Questions-P-AH

## 2023-01-30 ENCOUNTER — Encounter (HOSPITAL_COMMUNITY): Payer: Self-pay | Admitting: Obstetrics & Gynecology

## 2023-01-30 ENCOUNTER — Inpatient Hospital Stay (HOSPITAL_COMMUNITY)
Admission: AD | Admit: 2023-01-30 | Discharge: 2023-01-30 | Disposition: A | Discharge status: Home / Self Care | Payer: 59 | Attending: Obstetrics & Gynecology | Admitting: Obstetrics & Gynecology

## 2023-01-30 DIAGNOSIS — M5459 Other low back pain: Secondary | ICD-10-CM | POA: Diagnosis not present

## 2023-01-30 DIAGNOSIS — O9912 Other diseases of the blood and blood-forming organs and certain disorders involving the immune mechanism complicating childbirth: Secondary | ICD-10-CM

## 2023-01-30 DIAGNOSIS — Z3A40 40 weeks gestation of pregnancy: Secondary | ICD-10-CM

## 2023-01-30 DIAGNOSIS — O26893 Other specified pregnancy related conditions, third trimester: Secondary | ICD-10-CM | POA: Diagnosis present

## 2023-01-30 DIAGNOSIS — O480 Post-term pregnancy: Secondary | ICD-10-CM

## 2023-01-30 DIAGNOSIS — M545 Low back pain, unspecified: Secondary | ICD-10-CM | POA: Insufficient documentation

## 2023-01-30 DIAGNOSIS — O0990 Supervision of high risk pregnancy, unspecified, unspecified trimester: Secondary | ICD-10-CM

## 2023-01-30 NOTE — MAU Provider Note (Signed)
  History     CSN: [id number]  Arrival date and time: 01/30/23 1607   None     Chief Complaint  Patient presents with   Back Pain   Pelvic Pain   HPI  22 yo g2p1 @ 40+2 presenting with low back pain. No weakness or numbness, present much of pregnancy but a little worse last few days. Then says main reason she came in was to get checked, see if we can schedule her delivery. Late to prenatal care, missed last appointment. No contractions, no bleeding, no LOF.   Past Medical History:  Diagnosis Date   Anemia    Medical history non-contributory     Past Surgical History:  Procedure Laterality Date   NO PAST SURGERIES      Family History  Problem Relation Age of Onset   Healthy Mother    Asthma Brother    Asthma Maternal Grandmother     Social History   Tobacco Use   Smoking status: Never   Smokeless tobacco: Never  Vaping Use   Vaping status: Never Used  Substance Use Topics   Alcohol use: Never   Drug use: Not Currently    Types: Marijuana    Comment: last used marijuana 11/26/2020    Allergies: No Known Allergies  Medications Prior to Admission  Medication Sig Dispense Refill Last Dose   Ferrous Sulfate (IRON SUPPLEMENT PO) Take 65 mg by mouth every other day.   01/29/2023   Prenatal Vit-Fe Fumarate-FA (MULTIVITAMIN-PRENATAL) 27-0.8 MG TABS tablet Take 1 tablet by mouth daily at 12 noon.   01/29/2023   ondansetron (ZOFRAN) 4 MG tablet Take 1 tablet (4 mg total) by mouth daily as needed for nausea or vomiting. 30 tablet 1    Prenat-Fe Poly-Methfol-FA-DHA (VITAFOL ULTRA) 29-0.6-0.4-200 MG CAPS Take 1 capsule by mouth daily. 30 capsule 11     Review of Systems Physical Exam   Blood pressure 117/73, pulse 85, temperature 98.3 F (36.8 C), temperature source Oral, resp. rate 18, height 5\' 3"  (1.6 m), weight 66.1 kg, last menstrual period 04/23/2022, SpO2 100%, unknown if currently breastfeeding.  Physical Exam NAD RRR Abdomen gravis, non-tender 5/5 lower  strength, distal sensation intact Vtx by leopold's 2/30/-3 rn exam 145/mod/+a/-d     Assessment and Plan  22 yo g2p1001 @ 40+2 here essentially because she wants to get her induction scheduled. Having some low back pain, no alarm symptoms or abnormality on exam. Not in labor, no signs abruption or pprom. Reactive nst. PDIOL scheduled for 11/30. Has gestational thrombocytopenia so obtained cbc today to assess, results pending at time of discharge.  Cherrie Gauze Naomii Kreger 01/30/2023, 5:31 PM

## 2023-01-30 NOTE — MAU Note (Signed)
Katie Baldwin is a 22 y.o. at [redacted]w[redacted]d here in MAU reporting: having really bad pelvic pain and back pain. Pain is constant and she is past her due date. No bleeding or LOF.  Reports +FM. Was 1 cm last month when checked.   Onset of complaint: 3days ago Pain score: 7 Vitals:   01/30/23 1619  BP: 124/72  Pulse: 83  Resp: 18  Temp: 98.3 F (36.8 C)  SpO2: 100%     FHT:148 Lab orders placed from triage:  urine collected

## 2023-01-31 ENCOUNTER — Telehealth (HOSPITAL_COMMUNITY): Payer: Self-pay | Admitting: *Deleted

## 2023-01-31 ENCOUNTER — Encounter (HOSPITAL_COMMUNITY): Payer: Self-pay

## 2023-01-31 NOTE — Telephone Encounter (Signed)
Preadmission screen  

## 2023-02-01 ENCOUNTER — Telehealth (HOSPITAL_COMMUNITY): Payer: Self-pay | Admitting: *Deleted

## 2023-02-01 NOTE — Telephone Encounter (Signed)
Preadmission screen  

## 2023-02-03 ENCOUNTER — Inpatient Hospital Stay (HOSPITAL_COMMUNITY)
Admission: AD | Admit: 2023-02-03 | Discharge: 2023-02-04 | DRG: 806 | Disposition: A | Discharge status: Home / Self Care | Payer: 59 | Attending: Obstetrics and Gynecology | Admitting: Obstetrics and Gynecology

## 2023-02-03 ENCOUNTER — Encounter (HOSPITAL_COMMUNITY): Payer: Self-pay | Admitting: Obstetrics & Gynecology

## 2023-02-03 ENCOUNTER — Other Ambulatory Visit: Payer: Self-pay

## 2023-02-03 ENCOUNTER — Encounter (HOSPITAL_COMMUNITY): Payer: Self-pay | Admitting: Obstetrics and Gynecology

## 2023-02-03 ENCOUNTER — Inpatient Hospital Stay (HOSPITAL_COMMUNITY)
Admission: AD | Admit: 2023-02-03 | Discharge: 2023-02-03 | Disposition: A | Discharge status: Home / Self Care | Payer: 59 | Source: Home / Self Care | Attending: Obstetrics & Gynecology | Admitting: Obstetrics & Gynecology

## 2023-02-03 ENCOUNTER — Inpatient Hospital Stay (HOSPITAL_COMMUNITY): Payer: 59 | Admitting: Anesthesiology

## 2023-02-03 DIAGNOSIS — O9902 Anemia complicating childbirth: Secondary | ICD-10-CM | POA: Diagnosis present

## 2023-02-03 DIAGNOSIS — Z3A40 40 weeks gestation of pregnancy: Secondary | ICD-10-CM | POA: Insufficient documentation

## 2023-02-03 DIAGNOSIS — Z148 Genetic carrier of other disease: Secondary | ICD-10-CM

## 2023-02-03 DIAGNOSIS — O9912 Other diseases of the blood and blood-forming organs and certain disorders involving the immune mechanism complicating childbirth: Secondary | ICD-10-CM | POA: Diagnosis present

## 2023-02-03 DIAGNOSIS — O471 False labor at or after 37 completed weeks of gestation: Secondary | ICD-10-CM | POA: Insufficient documentation

## 2023-02-03 DIAGNOSIS — O99019 Anemia complicating pregnancy, unspecified trimester: Secondary | ICD-10-CM | POA: Diagnosis present

## 2023-02-03 DIAGNOSIS — O0933 Supervision of pregnancy with insufficient antenatal care, third trimester: Secondary | ICD-10-CM | POA: Diagnosis not present

## 2023-02-03 DIAGNOSIS — Z825 Family history of asthma and other chronic lower respiratory diseases: Secondary | ICD-10-CM

## 2023-02-03 DIAGNOSIS — D696 Thrombocytopenia, unspecified: Secondary | ICD-10-CM | POA: Diagnosis present

## 2023-02-03 DIAGNOSIS — O0930 Supervision of pregnancy with insufficient antenatal care, unspecified trimester: Secondary | ICD-10-CM

## 2023-02-03 DIAGNOSIS — O480 Post-term pregnancy: Secondary | ICD-10-CM | POA: Diagnosis present

## 2023-02-03 DIAGNOSIS — D563 Thalassemia minor: Secondary | ICD-10-CM | POA: Diagnosis present

## 2023-02-03 DIAGNOSIS — O09899 Supervision of other high risk pregnancies, unspecified trimester: Secondary | ICD-10-CM

## 2023-02-03 DIAGNOSIS — O479 False labor, unspecified: Secondary | ICD-10-CM

## 2023-02-03 LAB — TYPE AND SCREEN
ABO/RH(D): B POS
Antibody Screen: NEGATIVE

## 2023-02-03 LAB — CBC
HCT: 37.7 % (ref 36.0–46.0)
Hemoglobin: 12.3 g/dL (ref 12.0–15.0)
MCH: 26 pg (ref 26.0–34.0)
MCHC: 32.6 g/dL (ref 30.0–36.0)
MCV: 79.7 fL — ABNORMAL LOW (ref 80.0–100.0)
Platelets: 77 10*3/uL — ABNORMAL LOW (ref 150–400)
RBC: 4.73 MIL/uL (ref 3.87–5.11)
RDW: 15.4 % (ref 11.5–15.5)
WBC: 6.7 10*3/uL (ref 4.0–10.5)
nRBC: 0 % (ref 0.0–0.2)

## 2023-02-03 LAB — RPR: RPR Ser Ql: NONREACTIVE

## 2023-02-03 MED ORDER — OXYTOCIN BOLUS FROM INFUSION
333.0000 mL | Freq: Once | INTRAVENOUS | Status: AC
  Administered 2023-02-03: 333 mL via INTRAVENOUS

## 2023-02-03 MED ORDER — PHENYLEPHRINE 80 MCG/ML (10ML) SYRINGE FOR IV PUSH (FOR BLOOD PRESSURE SUPPORT): 80.0000 ug | PREFILLED_SYRINGE | INTRAVENOUS | Status: DC | PRN

## 2023-02-03 MED ORDER — SOD CITRATE-CITRIC ACID 500-334 MG/5ML PO SOLN: 30.0000 mL | ORAL | Status: DC | PRN

## 2023-02-03 MED ORDER — COCONUT OIL OIL: 1.0000 | TOPICAL_OIL | Status: DC | PRN

## 2023-02-03 MED ORDER — ONDANSETRON HCL 4 MG/2ML IJ SOLN: 4.0000 mg | INTRAMUSCULAR | Status: DC | PRN

## 2023-02-03 MED ORDER — OXYTOCIN-SODIUM CHLORIDE 30-0.9 UT/500ML-% IV SOLN
2.5000 [IU]/h | INTRAVENOUS | Status: DC
Start: 2023-02-03 — End: 2023-02-03
  Administered 2023-02-03: 2.5 [IU]/h via INTRAVENOUS
  Filled 2023-02-03: qty 500

## 2023-02-03 MED ORDER — LACTATED RINGERS IV SOLN
500.0000 mL | Freq: Once | INTRAVENOUS | Status: DC
Start: 2023-02-03 — End: 2023-02-03

## 2023-02-03 MED ORDER — OXYCODONE HCL 5 MG PO TABS: 5.0000 mg | ORAL_TABLET | ORAL | Status: DC | PRN

## 2023-02-03 MED ORDER — SENNOSIDES-DOCUSATE SODIUM 8.6-50 MG PO TABS
2.0000 | ORAL_TABLET | ORAL | Status: DC
  Administered 2023-02-03: 2 via ORAL
  Filled 2023-02-03: qty 2

## 2023-02-03 MED ORDER — LACTATED RINGERS IV SOLN
500.0000 mL | INTRAVENOUS | Status: DC | PRN
  Administered 2023-02-03: 1000 mL via INTRAVENOUS

## 2023-02-03 MED ORDER — ONDANSETRON HCL 4 MG/2ML IJ SOLN: 4.0000 mg | Freq: Four times a day (QID) | INTRAMUSCULAR | Status: DC | PRN

## 2023-02-03 MED ORDER — SIMETHICONE 80 MG PO CHEW: 80.0000 mg | CHEWABLE_TABLET | ORAL | Status: DC | PRN

## 2023-02-03 MED ORDER — MEASLES, MUMPS & RUBELLA VAC IJ SOLR: 0.5000 mL | Freq: Once | INTRAMUSCULAR | Status: DC

## 2023-02-03 MED ORDER — OXYTOCIN-SODIUM CHLORIDE 30-0.9 UT/500ML-% IV SOLN: 2.5000 [IU]/h | INTRAVENOUS | Status: DC | PRN

## 2023-02-03 MED ORDER — DIBUCAINE (PERIANAL) 1 % EX OINT: 1.0000 | TOPICAL_OINTMENT | CUTANEOUS | Status: DC | PRN

## 2023-02-03 MED ORDER — IBUPROFEN 600 MG PO TABS
600.0000 mg | ORAL_TABLET | Freq: Four times a day (QID) | ORAL | Status: DC
  Administered 2023-02-03 – 2023-02-04 (×4): 600 mg via ORAL
  Filled 2023-02-03 (×4): qty 1

## 2023-02-03 MED ORDER — EPHEDRINE 5 MG/ML INJ: 10.0000 mg | INTRAVENOUS | Status: DC | PRN

## 2023-02-03 MED ORDER — LIDOCAINE HCL (PF) 1 % IJ SOLN: 30.0000 mL | INTRAMUSCULAR | Status: DC | PRN

## 2023-02-03 MED ORDER — BUPIVACAINE IN DEXTROSE 0.75-8.25 % IT SOLN
INTRATHECAL | Status: DC | PRN
  Administered 2023-02-03: 1.2 mL via INTRATHECAL

## 2023-02-03 MED ORDER — WITCH HAZEL-GLYCERIN EX PADS: 1.0000 | MEDICATED_PAD | CUTANEOUS | Status: DC | PRN

## 2023-02-03 MED ORDER — BENZOCAINE-MENTHOL 20-0.5 % EX AERO: 1.0000 | INHALATION_SPRAY | CUTANEOUS | Status: DC | PRN

## 2023-02-03 MED ORDER — DIPHENHYDRAMINE HCL 50 MG/ML IJ SOLN: 12.5000 mg | INTRAMUSCULAR | Status: DC | PRN

## 2023-02-03 MED ORDER — ACETAMINOPHEN 325 MG PO TABS
650.0000 mg | ORAL_TABLET | ORAL | Status: DC | PRN
  Administered 2023-02-03 (×2): 650 mg via ORAL
  Filled 2023-02-03 (×2): qty 2

## 2023-02-03 MED ORDER — FENTANYL-BUPIVACAINE-NACL 0.5-0.125-0.9 MG/250ML-% EP SOLN
EPIDURAL | Status: AC
  Filled 2023-02-03: qty 250

## 2023-02-03 MED ORDER — FENTANYL-BUPIVACAINE-NACL 0.5-0.125-0.9 MG/250ML-% EP SOLN: 12.0000 mL/h | EPIDURAL | Status: DC | PRN

## 2023-02-03 MED ORDER — LACTATED RINGERS IV SOLN: INTRAVENOUS | Status: DC

## 2023-02-03 MED ORDER — PRENATAL MULTIVITAMIN CH
1.0000 | ORAL_TABLET | Freq: Every day | ORAL | Status: DC
  Administered 2023-02-03 – 2023-02-04 (×2): 1 via ORAL
  Filled 2023-02-03 (×2): qty 1

## 2023-02-03 MED ORDER — ZOLPIDEM TARTRATE 5 MG PO TABS: 5.0000 mg | ORAL_TABLET | Freq: Every evening | ORAL | Status: DC | PRN

## 2023-02-03 MED ORDER — DIPHENHYDRAMINE HCL 25 MG PO CAPS: 25.0000 mg | ORAL_CAPSULE | Freq: Four times a day (QID) | ORAL | Status: DC | PRN

## 2023-02-03 MED ORDER — ONDANSETRON HCL 4 MG PO TABS: 4.0000 mg | ORAL_TABLET | ORAL | Status: DC | PRN

## 2023-02-03 MED ORDER — ACETAMINOPHEN 325 MG PO TABS: 650.0000 mg | ORAL_TABLET | ORAL | Status: DC | PRN

## 2023-02-03 NOTE — Progress Notes (Signed)
Dr.Wouk approved Ibuprofen for Pt with Platelets of 77.

## 2023-02-03 NOTE — MAU Note (Signed)
.  Katie Baldwin is a 22 y.o. at [redacted]w[redacted]d here in MAU reporting: ctx every 3 minutes since 0330. Noticed some spotting when she wiped. Denies LOF. +FM   Onset of complaint: 0330 Pain score: 8 Vitals:   02/03/23 0422  BP: 131/74  Pulse: 70  Resp: 19  Temp: (!) 97.4 F (36.3 C)  SpO2: 100%     FHT:165 Lab orders placed from triage:  labor eval

## 2023-02-03 NOTE — Progress Notes (Deleted)
LABOR AND DELIVERY ADMISSION HISTORY AND PHYSICAL NOTE  Katie Baldwin is a 22 y.o. female G2P1001 with IUP at [redacted]w[redacted]d by L/6 presenting for painful contractions.   She reports positive fetal movement. She denies leakage of fluid or vaginal bleeding.  Prenatal History/Complications:  Past Medical History: Past Medical History:  Diagnosis Date   Anemia    Medical history non-contributory     Past Surgical History: Past Surgical History:  Procedure Laterality Date   NO PAST SURGERIES      Obstetrical History: OB History     Gravida  2   Para  1   Term  1   Preterm      AB      Living  1      SAB      IAB      Ectopic      Multiple  0   Live Births  1           Social History: Social History   Socioeconomic History   Marital status: Single    Spouse name: Not on file   Number of children: Not on file   Years of education: Not on file   Highest education level: Not on file  Occupational History   Not on file  Tobacco Use   Smoking status: Never   Smokeless tobacco: Never  Vaping Use   Vaping status: Never Used  Substance and Sexual Activity   Alcohol use: Never   Drug use: Not Currently    Types: Marijuana    Comment: last used marijuana 11/26/2020   Sexual activity: Yes    Birth control/protection: None  Other Topics Concern   Not on file  Social History Narrative   Not on file   Social Determinants of Health   Financial Resource Strain: Not on file  Food Insecurity: No Food Insecurity (07/14/2021)   Hunger Vital Sign    Worried About Running Out of Food in the Last Year: Never true    Ran Out of Food in the Last Year: Never true  Transportation Needs: No Transportation Needs (07/14/2021)   PRAPARE - Administrator, Civil Service (Medical): No    Lack of Transportation (Non-Medical): No  Physical Activity: Not on file  Stress: Not on file  Social Connections: Not on file    Family History: Family History   Problem Relation Age of Onset   Healthy Mother    Asthma Brother    Asthma Maternal Grandmother     Allergies: No Known Allergies  Medications Prior to Admission  Medication Sig Dispense Refill Last Dose   Ferrous Sulfate (IRON SUPPLEMENT PO) Take 65 mg by mouth every other day.   Past Week   ondansetron (ZOFRAN) 4 MG tablet Take 1 tablet (4 mg total) by mouth daily as needed for nausea or vomiting. 30 tablet 1 Past Month   Prenatal Vit-Fe Fumarate-FA (MULTIVITAMIN-PRENATAL) 27-0.8 MG TABS tablet Take 1 tablet by mouth daily at 12 noon.   Past Week   Prenat-Fe Poly-Methfol-FA-DHA (VITAFOL ULTRA) 29-0.6-0.4-200 MG CAPS Take 1 capsule by mouth daily. 30 capsule 11      Review of Systems   All systems reviewed and negative except as stated in HPI  Last menstrual period 04/23/2022, unknown if currently breastfeeding. General appearance:  Lungs: clear to auscultation bilaterally Heart: regular rate and rhythm Abdomen: soft, non-tender; bowel sounds normal Extremities: No calf swelling or tenderness Presentation: cephalic per rn exam Fetal monitoring: 125/mod/+a/-d Uterine activity:  q 3 Dilation: 8 Effacement (%): 90 Station: 0 Exam by:: Valla Leaver, RN   Prenatal labs: ABO, Rh: B/Positive/-- (08/12 1010) Antibody: Negative (08/12 1010) Rubella: 4.57 (08/12 1010) RPR: Non Reactive (09/06 0847)  HBsAg: Negative (08/12 1010)  HIV: Non Reactive (09/06 0847)  GBS:   neg 2 hr Glucola: wnl Genetic screening:  neg Anatomy US: wnl  Prenatal Transfer Tool  Maternal Diabetes: No Genetic Screening: Normal Maternal Ultrasounds/Referrals: Normal Fetal Ultrasounds or other Referrals:  None Maternal Substance Abuse:  No Significant Maternal Medications:  None Significant Maternal Lab Results: Group B Strep negative  No results found for this or any previous visit (from the past 24 hour(s)).  Patient Active Problem List   Diagnosis Date Noted   Normal labor 02/03/2023    Alpha thalassemia silent carrier 10/17/2022   Late prenatal care affecting pregnancy 10/17/2022   Short interval between pregnancies affecting pregnancy, antepartum 10/13/2022   Supervision of high risk pregnancy, antepartum 10/06/2022   Gestational thrombocytopenia (HCC) 07/08/2021   Anemia of mother in pregnancy, antepartum 01/25/2021    Assessment: Katie Baldwin is a 22 y.o. G2P1001 at [redacted]w[redacted]d here for active labor  #Labor: expectant #Pain: Waiting for epidural #FWB: Cat 1 #ID:  Gbs neg #MOF: bottle #MOC: undecided #Circ:  Yes, inpatient  Silvano Bilis 02/03/2023, 9:51 AM

## 2023-02-03 NOTE — Anesthesia Preprocedure Evaluation (Signed)
Anesthesia Evaluation  Patient identified by MRN, date of birth, ID band Patient awake    Reviewed: Allergy & Precautions, NPO status , Patient's Chart, lab work & pertinent test results  Airway Mallampati: I       Dental no notable dental hx.    Pulmonary neg pulmonary ROS   Pulmonary exam normal        Cardiovascular negative cardio ROS Normal cardiovascular exam     Neuro/Psych negative neurological ROS  negative psych ROS   GI/Hepatic negative GI ROS, Neg liver ROS,,,  Endo/Other  negative endocrine ROS    Renal/GU negative Renal ROS  negative genitourinary   Musculoskeletal negative musculoskeletal ROS (+)    Abdominal Normal abdominal exam  (+)   Peds negative pediatric ROS (+)  Hematology  (+) Blood dyscrasia, anemia Hb 11.4, plt 115- gestational thormbocytopenia    Anesthesia Other Findings   Reproductive/Obstetrics (+) Pregnancy                             Anesthesia Physical Anesthesia Plan  ASA: 2  Anesthesia Plan: Spinal   Post-op Pain Management:    Induction:   PONV Risk Score and Plan:   Airway Management Planned:   Additional Equipment:   Intra-op Plan:   Post-operative Plan:   Informed Consent: I have reviewed the patients History and Physical, chart, labs and discussed the procedure including the risks, benefits and alternatives for the proposed anesthesia with the patient or authorized representative who has indicated his/her understanding and acceptance.       Plan Discussed with:   Anesthesia Plan Comments:         Anesthesia Quick Evaluation

## 2023-02-03 NOTE — Anesthesia Procedure Notes (Addendum)
Spinal  Patient location during procedure: OR Start time: 02/03/2023 10:40 AM End time: 02/03/2023 10:42 AM Reason for block: surgical anesthesia Staffing Performed: anesthesiologist  Anesthesiologist: Leilani Able, MD Performed by: Leilani Able, MD Authorized by: Leilani Able, MD   Preanesthetic Checklist Completed: patient identified, IV checked, site marked, risks and benefits discussed, surgical consent, monitors and equipment checked, pre-op evaluation and timeout performed Spinal Block Patient position: sitting Prep: DuraPrep and site prepped and draped Patient monitoring: continuous pulse ox and blood pressure Approach: midline Location: L3-4 Injection technique: single-shot Needle Needle type: Pencan  Needle gauge: 24 G Needle length: 10 cm Needle insertion depth: 6 cm Assessment Sensory level: T10 Events: CSF return

## 2023-02-03 NOTE — MAU Note (Signed)
.  Julio Kaatz is a 22 y.o. at [redacted]w[redacted]d here in MAU reporting: contractions every 2 minutes and bloody show. Denies LOF, endorses positive FM  Onset of complaint: 01/24/23 Pain score: 10/10 There were no vitals filed for this visit.   FHT:140s Lab orders placed from triage: Ashok Pall, MD called for admit orders. SVE 8/90/0 vertex by palpation

## 2023-02-03 NOTE — H&P (Signed)
LABOR AND DELIVERY ADMISSION HISTORY AND PHYSICAL NOTE   Katie Baldwin is a 22 y.o. female G2P1001 with IUP at [redacted]w[redacted]d by L/6 presenting for painful contractions.    She reports positive fetal movement. She denies leakage of fluid or vaginal bleeding.   Prenatal History/Complications:   Past Medical History:     Past Medical History:  Diagnosis Date   Anemia     Medical history non-contributory            Past Surgical History:      Past Surgical History:  Procedure Laterality Date   NO PAST SURGERIES              Obstetrical History: OB History       Gravida  2   Para  1   Term  1   Preterm      AB      Living  1        SAB      IAB      Ectopic      Multiple  0   Live Births  1               Social History: Social History         Socioeconomic History   Marital status: Single      Spouse name: Not on file   Number of children: Not on file   Years of education: Not on file   Highest education level: Not on file  Occupational History   Not on file  Tobacco Use   Smoking status: Never   Smokeless tobacco: Never  Vaping Use   Vaping status: Never Used  Substance and Sexual Activity   Alcohol use: Never   Drug use: Not Currently      Types: Marijuana      Comment: last used marijuana 11/26/2020   Sexual activity: Yes      Birth control/protection: None  Other Topics Concern   Not on file  Social History Narrative   Not on file    Social Determinants of Health        Financial Resource Strain: Not on file  Food Insecurity: No Food Insecurity (07/14/2021)    Hunger Vital Sign     Worried About Running Out of Food in the Last Year: Never true     Ran Out of Food in the Last Year: Never true  Transportation Needs: No Transportation Needs (07/14/2021)    PRAPARE - Therapist, art (Medical): No     Lack of Transportation (Non-Medical): No  Physical Activity: Not on file  Stress: Not on file  Social  Connections: Not on file      Family History:      Family History  Problem Relation Age of Onset   Healthy Mother     Asthma Brother     Asthma Maternal Grandmother            Allergies: Allergies  No Known Allergies            Medications Prior to Admission  Medication Sig Dispense Refill Last Dose   Ferrous Sulfate (IRON SUPPLEMENT PO) Take 65 mg by mouth every other day.     Past Week   ondansetron (ZOFRAN) 4 MG tablet Take 1 tablet (4 mg total) by mouth daily as needed for nausea or vomiting. 30 tablet 1 Past Month   Prenatal Vit-Fe Fumarate-FA (MULTIVITAMIN-PRENATAL) 27-0.8 MG TABS tablet Take 1  tablet by mouth daily at 12 noon.     Past Week   Prenat-Fe Poly-Methfol-FA-DHA (VITAFOL ULTRA) 29-0.6-0.4-200 MG CAPS Take 1 capsule by mouth daily. 30 capsule 11              Review of Systems    All systems reviewed and negative except as stated in HPI   Last menstrual period 04/23/2022, unknown if currently breastfeeding. General appearance:  Lungs: clear to auscultation bilaterally Heart: regular rate and rhythm Abdomen: soft, non-tender; bowel sounds normal Extremities: No calf swelling or tenderness Presentation: cephalic per rn exam Fetal monitoring: 125/mod/+a/-d Uterine activity: q 3 Dilation: 8 Effacement (%): 90 Station: 0 Exam by:: Valla Leaver, RN     Prenatal labs: ABO, Rh: B/Positive/-- (08/12 1010) Antibody: Negative (08/12 1010) Rubella: 4.57 (08/12 1010) RPR: Non Reactive (09/06 0847)  HBsAg: Negative (08/12 1010)  HIV: Non Reactive (09/06 0847)  GBS:   neg 2 hr Glucola: wnl Genetic screening:  neg Anatomy US: wnl   Prenatal Transfer Tool  Maternal Diabetes: No Genetic Screening: Normal Maternal Ultrasounds/Referrals: Normal Fetal Ultrasounds or other Referrals:  None Maternal Substance Abuse:  No Significant Maternal Medications:  None Significant Maternal Lab Results: Group B Strep negative   No results found for this or any  previous visit (from the past 24 hour(s)).       Patient Active Problem List    Diagnosis Date Noted   Normal labor 02/03/2023   Alpha thalassemia silent carrier 10/17/2022   Late prenatal care affecting pregnancy 10/17/2022   Short interval between pregnancies affecting pregnancy, antepartum 10/13/2022   Supervision of high risk pregnancy, antepartum 10/06/2022   Gestational thrombocytopenia (HCC) 07/08/2021   Anemia of mother in pregnancy, antepartum 01/25/2021      Assessment: Katie Baldwin is a 22 y.o. G2P1001 at [redacted]w[redacted]d here for active labor   #Labor: expectant #Pain:  Waiting for epidural #FWB: Cat 1 #ID:      Gbs neg #MOF: bottle #MOC: undecided #Circ:   Yes, inpatient   Silvano Bilis 02/03/2023, 9:51 AM

## 2023-02-03 NOTE — Lactation Note (Signed)
This note was copied from a baby's chart. Lactation Consultation Note  Patient Name: Katie Baldwin Date: 02/03/2023 Age:20 hours Reason for consult: Initial assessment;1st time breastfeeding;Term  P2- MOB is an experienced mom, but this is her first time breastfeeding. MOB states that she plans to both breast and formula feed. MOB reports that infant has been nursing well so far, but her nipples are sore due to the new sensation. LC requested to see her nipples to ensure that there is no breakdown. MOB agreed and showed LC. LC noted no breakdown of the nipple, areola or breast. MOB expressed colostrum from both breasts with one expression.   LC reviewed feeding infant on cue 8-12x in 24 hrs, not allowing infant to go over 3 hrs without a feeding, CDC milk storage guidelines and LC services handout. LC encouraged MOB to call for further lactation support as needed.  Maternal Data Does the patient have breastfeeding experience prior to this delivery?: No  Feeding Mother's Current Feeding Choice: Breast Milk and Formula   Lactation Tools Discussed/Used Pump Education: Milk Storage  Interventions Interventions: Breast feeding basics reviewed;Education;LC Services brochure  Discharge Discharge Education: Warning signs for feeding baby WIC Program: Yes  Consult Status Consult Status: Follow-up Date: 02/04/23 Follow-up type: In-patient    Dema Severin BS, IBCLC 02/03/2023, 4:18 PM

## 2023-02-03 NOTE — Discharge Summary (Signed)
Postpartum Discharge Summary  Date of Service updated***     Patient Name: Katie Baldwin DOB: August 31, 2000 MRN: [id number]  Date of admission: 02/03/2023 Delivery date:02/03/2023 Delivering provider: Shonna Chock [place] Date of discharge: 02/03/2023  Admitting diagnosis: Normal labor [O80, Z37.9] Intrauterine pregnancy: [redacted]w[redacted]d     Secondary diagnosis:  Active Problems:   Normal labor  Additional problems: ***    Discharge diagnosis: Term Pregnancy Delivered                                              Post partum procedures:{Postpartum procedures:23558} Augmentation: AROM Complications: {OB Labor/Delivery Complications:20784}  Hospital course: Onset of Labor With Vaginal Delivery      22 y.o. yo G2P1001 at [redacted]w[redacted]d was admitted in Active Labor on 02/03/2023. Labor course was complicated by gestational thrombocytopenia, admission platelets 77.  Membrane Rupture Time/Date: 10:47 AM,02/03/2023  Delivery Method:Vaginal, Spontaneous Operative Delivery:N/A Episiotomy: None Lacerations:  None Patient had a postpartum course complicated by ***.  She is ambulating, tolerating a regular diet, passing flatus, and urinating well. Patient is discharged home in stable condition on 02/03/23.  Newborn Data: Birth date:02/03/2023 Birth time:11:32 AM Gender:Female Living status:Living Apgars: ,  Weight:   Magnesium Sulfate received: {Mag received:30440022} BMZ received: No Rhophylac:N/A MMR:{MMR:30440033} T-DaP:{Tdap:23962} Flu: {GMW:10272} RSV Vaccine received: {RSV:31013} Transfusion:{Transfusion received:30440034}  Immunizations received: Immunization History  Administered Date(s) Administered   Influenza, Seasonal, Injecte, Preservative Fre 11/11/2022   Influenza,inj,Quad PF,6+ Mos 05/07/2021   Tdap 05/07/2021, 11/11/2022    Physical exam  Vitals:   02/03/23 1106 02/03/23 1111 02/03/23 1116 02/03/23 1117  BP: 113/67 113/85 110/89 110/89  Pulse: (!) 55 (!) 156 (!) 106  (!) 106  Resp: 16  16 16   Temp:      TempSrc:       General: {Exam; general:21111117} Lochia: {Desc; appropriate/inappropriate:30686::"appropriate"} Uterine Fundus: {Desc; firm/soft:30687} Incision: {Exam; incision:21111123} DVT Evaluation: {Exam; dvt:2111122} Labs: Lab Results  Component Value Date   WBC 6.7 02/03/2023   HGB 12.3 02/03/2023   HCT 37.7 02/03/2023   MCV 79.7 (L) 02/03/2023   PLT 77 (L) 02/03/2023      Latest Ref Rng & Units 12/30/2022   11:55 AM  CMP  Glucose 70 - 99 mg/dL 536   BUN 6 - 20 mg/dL <5   Creatinine 6.44 - 1.00 mg/dL 0.34   Sodium 742 - 595 mmol/L 137   Potassium 3.5 - 5.1 mmol/L 3.3   Chloride 98 - 111 mmol/L 104   CO2 22 - 32 mmol/L 23   Calcium 8.9 - 10.3 mg/dL 8.8   Total Protein 6.5 - 8.1 g/dL 6.4   Total Bilirubin 0.3 - 1.2 mg/dL 0.5   Alkaline Phos 38 - 126 U/L 86   AST 15 - 41 U/L 21   ALT 0 - 44 U/L 12    Edinburgh Score:    08/06/2021    2:04 PM  Edinburgh Postnatal Depression Scale Screening Tool  I have been able to laugh and see the funny side of things. 0  I have looked forward with enjoyment to things. 0  I have blamed myself unnecessarily when things went wrong. 0  I have been anxious or worried for no good reason. 0  I have felt scared or panicky for no good reason. 0  Things have been getting on top of me. 0  I have been  so unhappy that I have had difficulty sleeping. 0  I have felt sad or miserable. 0  I have been so unhappy that I have been crying. 0  The thought of harming myself has occurred to me. 0  Edinburgh Postnatal Depression Scale Total 0   No data recorded  After visit meds:  Allergies as of 02/03/2023   No Known Allergies   Med Rec must be completed prior to using this [facility]***        Discharge home in stable condition Infant Feeding: {Baby feeding:23562} Infant Disposition:{CHL IP OB HOME WITH ZHYQMV:78469} Discharge instruction: per After Visit Summary and Postpartum  booklet. Activity: Advance as tolerated. Pelvic rest for 6 weeks.  Diet: {OB GEXB:28413244} Future Appointments:No future appointments. Follow up Visit:  Message sent by dr. Ashok Pall on 11/29  Please schedule this patient for a In person postpartum visit in 4 weeks with the following provider: Any provider Additional Postpartum F/U: check platelets {Risk level:23960} pregnancy complicated by: {complication:23959} Delivery mode:  Vaginal, Spontaneous Anticipated Birth Control:  {Birth Control:23956}   02/03/2023 Silvano Bilis, MD

## 2023-02-04 ENCOUNTER — Inpatient Hospital Stay (HOSPITAL_COMMUNITY): Payer: 59

## 2023-02-04 ENCOUNTER — Inpatient Hospital Stay (HOSPITAL_COMMUNITY): Admission: RE | Admit: 2023-02-04 | Payer: 59 | Source: Home / Self Care | Admitting: Obstetrics & Gynecology

## 2023-02-04 LAB — CBC
HCT: 34.4 % — ABNORMAL LOW (ref 36.0–46.0)
Hemoglobin: 11 g/dL — ABNORMAL LOW (ref 12.0–15.0)
MCH: 25.4 pg — ABNORMAL LOW (ref 26.0–34.0)
MCHC: 32 g/dL (ref 30.0–36.0)
MCV: 79.4 fL — ABNORMAL LOW (ref 80.0–100.0)
Platelets: 83 10*3/uL — ABNORMAL LOW (ref 150–400)
RBC: 4.33 MIL/uL (ref 3.87–5.11)
RDW: 15.4 % (ref 11.5–15.5)
WBC: 8.6 10*3/uL (ref 4.0–10.5)
nRBC: 0 % (ref 0.0–0.2)

## 2023-02-04 MED ORDER — IBUPROFEN 600 MG PO TABS: 600.0000 mg | ORAL_TABLET | Freq: Four times a day (QID) | ORAL | 0 refills | Status: DC | PRN

## 2023-02-04 NOTE — Anesthesia Postprocedure Evaluation (Signed)
Anesthesia Post Note  Patient: Katie Baldwin  Procedure(s) Performed: spinal     Patient location during evaluation: Mother Baby Anesthesia Type: Spinal Level of consciousness: awake and alert Pain management: pain level controlled Vital Signs Assessment: post-procedure vital signs reviewed and stable Respiratory status: spontaneous breathing, nonlabored ventilation and respiratory function stable Cardiovascular status: stable Postop Assessment: no headache, no backache and epidural receding Anesthetic complications: no   No notable events documented.  Last Vitals:  Vitals:   02/04/23 0202 02/04/23 0527  BP: 114/74 116/78  Pulse: (!) 58 (!) 57  Resp: 17 16  Temp: 36.9 C 36.9 C  SpO2: 100% 100%    Last Pain:  Vitals:   02/04/23 1211  TempSrc:   PainSc: 0-No pain   Pain Goal:                   Rica Records

## 2023-02-04 NOTE — Lactation Note (Signed)
This note was copied from a baby's chart. Lactation Consultation Note  Patient Name: Katie Baldwin [id number] Date: 02/04/2023 Age:20 hours   LC to room for follow-up consult. FOB awake, at bedside. Mom and baby are asleep. LC to return for follow up.      Su Grand 02/04/2023, 9:52 AM

## 2023-02-04 NOTE — Lactation Note (Signed)
This note was copied from a baby's chart. Lactation Consultation Note  Patient Name: Katie Baldwin [id number] Date: 02/04/2023 Age:20 hours Reason for consult: Follow-up assessment  P2 mom consulted for follow up at 26 hours. Infant asleep during visit. Patient is formula feeding and offering the breast but reports a desire to now exclusively pump and bottle feed breast milk and supplemental formula. LC discussed setting up a breast pump. Patient agreed and was set up using size 24mm flanges. Discussed cleaning and use of pump, use of manual pump at home and how to reach lactation once home. Total pumping output was 8 ml.   Hand expression demonstrated; expressed easily. Patient repeated back.  Patient is set to discharge today and does not have a pump at home. WIC referral sent yesterday by lactation. Patient instructed to use manual pump at home until a DEBP is available for use.   Educated on discharge teaching and outpatient recommendation. Patient understands education.  Maternal Data Has patient been taught Hand Expression?: Yes  Feeding Mother's Current Feeding Choice: Breast Milk and Formula  LATCH Score    Infant asleep during consult and mother has chosen to exclusively pump and bottle feed breast milk and formula.  Lactation Tools Discussed/Used Tools: Pump;Flanges;Comfort gels Flange Size: 24 Breast pump type: Double-Electric Breast Pump Pump Education: Setup, frequency, and cleaning;Milk Storage Reason for Pumping: supplementing with formula, plus mother's choice to exclusively pump Pumping frequency: encouraged pumping every 2-3 hours; especially when baby eats Pumped volume: 8 mL  Interventions Interventions: Breast feeding basics reviewed;Hand express;Expressed milk;DEBP;Comfort gels;Education;LC Services brochure;CDC Guidelines for Breast Pump Cleaning  Discharge Discharge Education: Engorgement and breast care;Outpatient recommendation Pump: DEBP WIC  Program: Yes  Consult Status Consult Status: Complete Date: 02/05/23 Follow-up type: In-patient    Katie Baldwin 02/04/2023, 2:20 PM

## 2023-02-14 ENCOUNTER — Telehealth (HOSPITAL_COMMUNITY): Payer: Self-pay | Admitting: *Deleted

## 2023-02-14 NOTE — Telephone Encounter (Signed)
02/14/2023  Name: Kelleen Verser MRN: [id number] DOB: 26-Sep-2000  Reason for Call:  Transition of Care Hospital Discharge Call  Contact Status: Patient Contact Status: Message  Language assistant needed: Interpreter Mode: Interpreter Not Needed        Follow-Up Questions:    Inocente Salles Postnatal Depression Scale:  In the Past 7 Days:    PHQ2-9 Depression Scale:     Discharge Follow-up:    Post-discharge interventions: NA  Salena Saner, RN 02/14/2023 12:42

## 2023-03-14 ENCOUNTER — Other Ambulatory Visit: Payer: Self-pay

## 2023-03-14 ENCOUNTER — Ambulatory Visit (INDEPENDENT_AMBULATORY_CARE_PROVIDER_SITE_OTHER): Payer: 59 | Admitting: Family Medicine

## 2023-03-14 ENCOUNTER — Encounter: Payer: Self-pay | Admitting: Family Medicine

## 2023-03-14 DIAGNOSIS — R030 Elevated blood-pressure reading, without diagnosis of hypertension: Secondary | ICD-10-CM | POA: Insufficient documentation

## 2023-03-14 DIAGNOSIS — Z3202 Encounter for pregnancy test, result negative: Secondary | ICD-10-CM | POA: Diagnosis not present

## 2023-03-14 DIAGNOSIS — Z30017 Encounter for initial prescription of implantable subdermal contraceptive: Secondary | ICD-10-CM | POA: Diagnosis not present

## 2023-03-14 DIAGNOSIS — D696 Thrombocytopenia, unspecified: Secondary | ICD-10-CM

## 2023-03-14 DIAGNOSIS — Z975 Presence of (intrauterine) contraceptive device: Secondary | ICD-10-CM | POA: Insufficient documentation

## 2023-03-14 LAB — POCT PREGNANCY, URINE: Preg Test, Ur: NEGATIVE

## 2023-03-14 MED ORDER — ETONOGESTREL 68 MG ~~LOC~~ IMPL
68.0000 mg | DRUG_IMPLANT | Freq: Once | SUBCUTANEOUS | Status: AC
  Administered 2023-03-14: 68 mg via SUBCUTANEOUS

## 2023-03-14 NOTE — Progress Notes (Signed)
     GYNECOLOGY OFFICE PROCEDURE NOTE  Katie Baldwin is a 23 y.o. [id number] here for Nexplanon  insertion.    Last pap smear: No results found for: DIAGPAP, HPVHIGH, ADEQPAP   Nexplanon  insertion Procedure Patient identified, informed consent performed, consent signed.   Patient does understand that irregular bleeding is a very common side effect of this medication. She was advised to have backup contraception for at least one week after placement. Pregnancy test in clinic today was negative .  Appropriate time out taken.  Patient's right arm was prepped and draped in the usual sterile fashion. Patient was prepped with alcohol swab and then injected with 3 ml of 1% lidocaine .  She was prepped with betadine, Nexplanon  removed from packaging,  Device confirmed in needle, then inserted full length of needle and withdrawn per handbook instructions. Nexplanon  was able to palpated in the patient's arm; patient palpated the insert herself. There was minimal blood loss.  Patient insertion site covered with guaze and a pressure bandage to reduce any bruising.  The patient tolerated the procedure well and was given post procedure instructions.  Will return in 2 weeks for UPT as she had unprotected sex two days prior.   Donnice CHRISTELLA Carolus, MD/MPH Family Medicine, [facility] for [employer], [facility]

## 2023-03-14 NOTE — Progress Notes (Signed)
 Post Partum Visit Note  Katie Baldwin is a 23 y.o. G19P2002 female who presents for a postpartum visit. She is 5 weeks 4 days postpartum following a normal spontaneous vaginal delivery.  I have fully reviewed the prenatal and intrapartum course. The delivery was at [redacted]w[redacted]d.  Anesthesia: spinal. Postpartum course has been uneventful. Baby is doing well. Baby is feeding by both breast and bottle - Similac . Bleeding red. Bowel function is abnormal: constipation . Bladder function is normal. Patient is sexually active. Contraception method is  unsure . Postpartum depression screening: negative.   Upstream - 03/14/23 1332       Pregnancy Intention Screening   Does the patient want to become pregnant in the next year? No    Does the patient's partner want to become pregnant in the next year? No    Would the patient like to discuss contraceptive options today? Yes      Contraception Wrap Up   Current Method No Method - Other Reason   recent pregnancy           The pregnancy intention screening data noted above was reviewed. Potential methods of contraception were discussed. The patient elected to proceed with No data recorded.   Edinburgh Postnatal Depression Scale - 03/14/23 1334       Edinburgh Postnatal Depression Scale:  In the Past 7 Days   I have been able to laugh and see the funny side of things. 0    I have looked forward with enjoyment to things. 0    I have blamed myself unnecessarily when things went wrong. 0    I have been anxious or worried for no good reason. 0    I have felt scared or panicky for no good reason. 0    Things have been getting on top of me. 0    I have been so unhappy that I have had difficulty sleeping. 0    I have felt sad or miserable. 0    I have been so unhappy that I have been crying. 0    The thought of harming myself has occurred to me. 0    Edinburgh Postnatal Depression Scale Total 0             Health Maintenance Due  Topic Date  Due   HPV VACCINES (1 - 3-dose series) Never done   COVID-19 Vaccine (1 - 2024-25 season) Never done    The following portions of the patient's history were reviewed and updated as appropriate: allergies, current medications, past family history, past medical history, past social history, past surgical history, and problem list.  Review of Systems Pertinent items noted in HPI and remainder of comprehensive ROS otherwise negative.  Objective:  BP (!) 148/100   Pulse 68   Wt 124 lb 12.8 oz (56.6 kg)   LMP 04/23/2022   Breastfeeding Yes   BMI 22.11 kg/m    BP Readings from Last 3 Encounters:  03/14/23 (!) 148/100  02/04/23 116/78  02/03/23 117/84     General:  alert, cooperative, and appears stated age   Breasts:  not indicated  Lungs: Comfortalbe on room air  Wound N/a  GU exam:  not indicated        Assessment:   Postpartum exam  Benign gestational thrombocytopenia in third trimester (HCC)  Elevated blood pressure reading in office without diagnosis of hypertension  Normal postpartum exam.   Plan:   Essential components of care per ACOG recommendations:  1.  Mood and well being: Patient with negative depression screening today. Reviewed local resources for support.  - Patient tobacco use? No.   - hx of drug use? No.    2. Infant care and feeding:  -Patient currently breastmilk feeding? No.  -Social determinants of health (SDOH) reviewed in EPIC. No concerns  3. Sexuality, contraception and birth spacing - Patient does not want a pregnancy in the next year.  Desired family size is 2 children.  - Reviewed reproductive life planning. Reviewed contraceptive methods based on pt preferences and effectiveness.  Patient desired Nexplanon  today, see procedure note.   - Discussed birth spacing of 18 months  4. Sleep and fatigue -Encouraged family/partner/community support of 4 hrs of uninterrupted sleep to help with mood and fatigue  5. Physical Recovery  -  Discussed patients delivery and complications. She describes her labor as good. - Patient had a Vaginal, no problems at delivery. Patient had a 1st degree laceration. Perineal healing reviewed. Patient expressed understanding - Patient has urinary incontinence? No. - Patient is safe to resume physical and sexual activity  6.  Health Maintenance - HM due items addressed No - up to date - Last pap smear No results found for: DIAGPAP Pap smear not done at today's visit.  -Breast Cancer screening indicated? No.   7. Chronic Disease/Pregnancy Condition follow up:  Gestational thrombocytopenia 1. Postpartum exam   2. Benign gestational thrombocytopenia in third trimester (HCC)   3. Elevated blood pressure reading in office without diagnosis of hypertension    2. Significant drop in platelets to 83 prior to discharge, also w mild anemia, recheck CBC today  3. Elevated blood pressure without diagnosis of hypertension Will check home BP's, return in two weeks for BP check and UPT    Donnice CHRISTELLA Carolus, MD/MPH Attending Family Medicine Physician, [facility] for [facility], [facility]

## 2023-03-15 LAB — CBC
Hematocrit: 39.9 % (ref 34.0–46.6)
Hemoglobin: 13.1 g/dL (ref 11.1–15.9)
MCH: 26.5 pg — ABNORMAL LOW (ref 26.6–33.0)
MCHC: 32.8 g/dL (ref 31.5–35.7)
MCV: 81 fL (ref 79–97)
Platelets: 157 10*3/uL (ref 150–450)
RBC: 4.95 x10E6/uL (ref 3.77–5.28)
RDW: 13.4 % (ref 11.7–15.4)
WBC: 4 10*3/uL (ref 3.4–10.8)

## 2023-03-28 ENCOUNTER — Ambulatory Visit (INDEPENDENT_AMBULATORY_CARE_PROVIDER_SITE_OTHER): Payer: 59 | Admitting: Lactation Services

## 2023-03-28 ENCOUNTER — Other Ambulatory Visit: Payer: Self-pay

## 2023-03-28 ENCOUNTER — Encounter: Payer: Self-pay | Admitting: Lactation Services

## 2023-03-28 VITALS — BP 117/66 | HR 86 | Wt 125.4 lb

## 2023-03-28 DIAGNOSIS — R030 Elevated blood-pressure reading, without diagnosis of hypertension: Secondary | ICD-10-CM

## 2023-03-28 DIAGNOSIS — Z3202 Encounter for pregnancy test, result negative: Secondary | ICD-10-CM

## 2023-03-28 LAB — POCT PREGNANCY, URINE: Preg Test, Ur: NEGATIVE

## 2023-03-28 MED ORDER — NORGESTIMATE-ETH ESTRADIOL 0.25-35 MG-MCG PO TABS: 1.0000 | ORAL_TABLET | Freq: Every day | ORAL | 2 refills | Status: DC

## 2023-03-28 NOTE — Progress Notes (Signed)
Patient in for BP check and Pregnancy test. UPT negative.  She reports some bright red bleeding after intercourse. She has some brown drainage also. She reports she has red and brown bleeding daily, sometimes on the pad, mostly with wiping. She voiced she wishes to have Nexplanon removed.  She reports she took the pill previously and would forget to take the pill.   Reviewed that bleeding can be from Nexplanon and also normal post partum bleeding. Reviewed you can bleed for 6-8 weeks after a delivery.   Patient reports she stopped BF 2 weeks ago. Discussed with Dr. Crissie Reese who recommended Ortho Cyclen for continued bleeding. Follow up appointment scheduled for 1 month. Reviewed with patient to set her alarm to take around the same time ago.

## 2023-04-20 ENCOUNTER — Ambulatory Visit: Payer: 59 | Admitting: Obstetrics & Gynecology

## 2023-06-15 ENCOUNTER — Ambulatory Visit (INDEPENDENT_AMBULATORY_CARE_PROVIDER_SITE_OTHER): Admitting: Obstetrics and Gynecology

## 2023-06-15 ENCOUNTER — Encounter: Payer: Self-pay | Admitting: Obstetrics and Gynecology

## 2023-06-15 ENCOUNTER — Other Ambulatory Visit: Payer: Self-pay

## 2023-06-15 VITALS — BP 132/82 | HR 71 | Wt 130.9 lb

## 2023-06-15 DIAGNOSIS — Z3009 Encounter for other general counseling and advice on contraception: Secondary | ICD-10-CM

## 2023-06-15 DIAGNOSIS — Z3046 Encounter for surveillance of implantable subdermal contraceptive: Secondary | ICD-10-CM

## 2023-06-15 MED ORDER — PHEXXI 1.8-1-0.4 % VA GEL: 1.0000 | Freq: Once | VAGINAL | 12 refills | Status: AC | PRN

## 2023-06-15 MED ORDER — NORETHIN ACE-ETH ESTRAD-FE 1-20 MG-MCG(24) PO TABS: 1.0000 | ORAL_TABLET | Freq: Every day | ORAL | 11 refills | Status: AC

## 2023-06-15 NOTE — Patient Instructions (Signed)
 Your NEXPLANON implant was just removed Here is some helpful information on what you can expect and how to care for your removal site. 24 Hours wear your top bandage The compression bandage helps minimize bruising.  3-5 Days keep your implant site covered While the insertion site is healing, keep the area covered with a smaller bandage.

## 2023-06-15 NOTE — Progress Notes (Signed)
 GYNECOLOGY VISIT  Patient name: Katie Baldwin MRN [id number]  Date of birth: 09/30/2000 Chief Complaint:   Contraception  History:  Katie Baldwin is a 23 y.o. [phone] being seen today for nexplanon removal. Has had prolonged, irregular bleeding with nexplanon. This is her first experience with nexplanon. Briefly tried OCPs to help but experienced headaches while taking it. She is open to new contraception but doesn't want anything that could affect her menses too much.  She has no CHC contraindications.   Seen 03/14/23 for PP with ME: nexplanon inserted   Past Medical History:  Diagnosis Date   Anemia    Medical history non-contributory    Normal labor 02/03/2023    Past Surgical History:  Procedure Laterality Date   NO PAST SURGERIES      The following portions of the patient's history were reviewed and updated as appropriate: allergies, current medications, past family history, past medical history, past social history, past surgical history and problem list.   Health Maintenance:   Last pap n/a Last mammogram: n/a   Review of Systems:  Pertinent items are noted in HPI. Comprehensive review of systems was otherwise negative.   Objective:  Physical Exam BP 132/82   Pulse 71   Wt 130 lb 14.4 oz (59.4 kg)   LMP 05/23/2023 (Approximate)   Breastfeeding No   BMI 23.19 kg/m    Physical Exam Vitals and nursing note reviewed.  Constitutional:      Appearance: Normal appearance.  HENT:     Head: Normocephalic and atraumatic.  Pulmonary:     Effort: Pulmonary effort is normal.  Skin:    General: Skin is warm and dry.  Neurological:     General: No focal deficit present.     Mental Status: She is alert.  Psychiatric:        Mood and Affect: Mood normal.        Behavior: Behavior normal.        Thought Content: Thought content normal.        Judgment: Judgment normal.     NEXPLANON REMOVAL The risks (including infection, bleeding, pain, and uterine  perforation) and benefits of the procedure were explained to the patient and Written informed consent was obtained.   Device was palpated in left upper arm. After time out, the skin was cleaned with alcohol and infiltrated with 3cc of 1% lidocaine with epinephrine was used to infiltrate the skin and subcutaneous tissue deep to the device. The area was cleaned with betadine x2.  Using an 11 blade, the skin was incised and the implant was removed intact.  The implant was shown to the patient.  The skin was cleaned, incision covered with Steri-Strips, and an adhesive bandage.  Arm was wrapped and post procedure instructions provided to the patient.      Assessment & Plan:   1. Nexplanon removal (Primary) Now s/p uncomplicated nexplanon removal.   2. Birth control counseling Reviewed all forms of birth control options available including abstinence; over the counter/barrier methods; hormonal contraceptive medication including pill, patch, ring, injection,contraceptive implant; hormonal and nonhormonal IUDs; permanent sterilization options including vasectomy and the various tubal sterilization modalities. Risks and benefits reviewed.  Questions were answered.  Information was given to patient to review.  Patient would like to try phexxi but open to starting pills and requesting both be sent to pharmacy.   - Norethindrone Acetate-Ethinyl Estrad-FE (LOESTRIN 24 FE) 1-20 MG-MCG(24) tablet; Take 1 tablet by mouth daily.  Dispense: 28 tablet;  Refill: 11 - Lactic Ac-Citric Ac-Pot Bitart (PHEXXI) 1.8-1-0.4 % GEL; Place 1 applicator vaginally once as needed for up to 1 dose. Use one per sexual encounter  Dispense: 5 g; Refill: 12   Routine preventative health maintenance measures emphasized.  Lorriane Shire, MD [facility] for [facility], [facility]

## 2023-07-05 ENCOUNTER — Other Ambulatory Visit: Payer: Self-pay

## 2023-07-05 ENCOUNTER — Emergency Department (HOSPITAL_COMMUNITY)
Admission: EM | Admit: 2023-07-05 | Discharge: 2023-07-05 | Disposition: A | Discharge status: Home / Self Care | Attending: Emergency Medicine | Admitting: Emergency Medicine

## 2023-07-05 ENCOUNTER — Emergency Department (HOSPITAL_COMMUNITY)

## 2023-07-05 ENCOUNTER — Encounter (HOSPITAL_COMMUNITY): Payer: Self-pay

## 2023-07-05 DIAGNOSIS — R0789 Other chest pain: Secondary | ICD-10-CM | POA: Diagnosis present

## 2023-07-05 DIAGNOSIS — R079 Chest pain, unspecified: Secondary | ICD-10-CM

## 2023-07-05 LAB — CBC
HCT: 38.1 % (ref 36.0–46.0)
Hemoglobin: 12.2 g/dL (ref 12.0–15.0)
MCH: 26.2 pg (ref 26.0–34.0)
MCHC: 32 g/dL (ref 30.0–36.0)
MCV: 81.8 fL (ref 80.0–100.0)
Platelets: 163 10*3/uL (ref 150–400)
RBC: 4.66 MIL/uL (ref 3.87–5.11)
RDW: 12.8 % (ref 11.5–15.5)
WBC: 4 10*3/uL (ref 4.0–10.5)
nRBC: 0 % (ref 0.0–0.2)

## 2023-07-05 LAB — BASIC METABOLIC PANEL WITH GFR
Anion gap: 8 (ref 5–15)
BUN: 9 mg/dL (ref 6–20)
CO2: 25 mmol/L (ref 22–32)
Calcium: 8.8 mg/dL — ABNORMAL LOW (ref 8.9–10.3)
Chloride: 106 mmol/L (ref 98–111)
Creatinine, Ser: 0.62 mg/dL (ref 0.44–1.00)
GFR, Estimated: >60 mL/min (ref >60)
Glucose, Bld: 95 mg/dL (ref 70–99)
Potassium: 3.4 mmol/L — ABNORMAL LOW (ref 3.5–5.1)
Sodium: 139 mmol/L (ref 135–145)

## 2023-07-05 LAB — TROPONIN I (HIGH SENSITIVITY): Troponin I (High Sensitivity): <2 ng/L (ref <18)

## 2023-07-05 LAB — HCG, SERUM, QUALITATIVE: Preg, Serum: NEGATIVE

## 2023-07-05 LAB — D-DIMER, QUANTITATIVE: D-Dimer, Quant: <0.27 ug{FEU}/mL (ref 0.00–0.50)

## 2023-07-05 NOTE — Discharge Instructions (Addendum)
 Start 400 mg ibuprofen  every 4-6 hours as needed for pain.  Return to the emergency department if you experience worsening chest pain, shortness of breath, loss of consciousness.  Follow-up with your PCP later this week.

## 2023-07-05 NOTE — ED Provider Notes (Signed)
 [place] EMERGENCY DEPARTMENT AT [facility] Provider Note   CSN: [id number] Arrival date & time: 07/05/23  0747     History  Chief Complaint  Patient presents with   Chest Pain    Katie Baldwin is a 23 y.o. female.  23 year old female presenting with chest pain.  Patient describes "cramping" pain in her anterior chest upon waking around 5 AM, pain is exacerbated when she takes a deep breath which she describes as "tightness in my chest".  Pain is intermittent in nature, lasting for 10 to 15 seconds and recurring every 10 minutes or so.  History of recent vaginal delivery in late November, she did have a Nexplanon  in place however this was removed 2 to 3 weeks ago, she is not breast-feeding.  She has a prescription for OCPs but has not filled this at the pharmacy.  She denies cough, shortness of breath, lower extremity swelling, abdominal pain, nausea/vomiting.    Chest Pain Associated symptoms: no abdominal pain, no cough and no shortness of breath        Home Medications Prior to Admission medications   Medication Sig Start Date End Date Taking? Authorizing Provider  Lactic Ac-Citric Ac-Pot Bitart (PHEXXI ) 1.8-1-0.4 % GEL Place 1 applicator vaginally once as needed for up to 1 dose. Use one per sexual encounter Patient not taking: Reported on 07/05/2023 06/15/23   Kiki Pelton, MD  Norethindrone  Acetate-Ethinyl Estrad-FE (LOESTRIN 24 FE) 1-20 MG-MCG(24) tablet Take 1 tablet by mouth daily. Patient not taking: Reported on 07/05/2023 06/15/23   Ajewole, Christana, MD      Allergies    Patient has no known allergies.    Review of Systems   Review of Systems  Respiratory:  Positive for chest tightness. Negative for cough and shortness of breath.   Cardiovascular:  Positive for chest pain. Negative for leg swelling.  Gastrointestinal:  Negative for abdominal pain.    Physical Exam Updated Vital Signs BP 127/89   Pulse 73   Temp 98.7 F (37.1 C) (Oral)    Resp 18   Ht 5\' 3"  (1.6 m)   Wt 59.9 kg   LMP 07/02/2023 (Exact Date)   SpO2 100%   Breastfeeding No   BMI 23.38 kg/m   Physical Exam Vitals and nursing note reviewed.  Constitutional:      General: She is not in acute distress. HENT:     Head: Normocephalic and atraumatic.  Eyes:     Extraocular Movements: Extraocular movements intact.     Pupils: Pupils are equal, round, and reactive to light.  Cardiovascular:     Rate and Rhythm: Normal rate and regular rhythm.     Pulses:          Radial pulses are 2+ on the right side and 2+ on the left side.       Dorsalis pedis pulses are 2+ on the right side and 2+ on the left side.     Heart sounds: Normal heart sounds. No murmur heard.    Comments: No TTP of anterior chest wall Pulmonary:     Effort: Pulmonary effort is normal. No respiratory distress.     Breath sounds: Normal breath sounds.  Abdominal:     Palpations: Abdomen is soft.  Musculoskeletal:     Cervical back: Normal range of motion and neck supple.     Right lower leg: No edema.     Left lower leg: No edema.  Skin:    General: Skin is warm and  dry.  Neurological:     General: No focal deficit present.     Mental Status: She is alert.     ED Results / Procedures / Treatments   Labs (all labs ordered are listed, but only abnormal results are displayed) Labs Reviewed  BASIC METABOLIC PANEL WITH GFR - Abnormal; Notable for the following components:      Result Value   Potassium 3.4 (*)    Calcium 8.8 (*)    All other components within normal limits  CBC  D-DIMER, QUANTITATIVE  HCG, SERUM, QUALITATIVE  POC URINE PREG, ED  TROPONIN I (HIGH SENSITIVITY)    EKG EKG Interpretation Date/Time:  Wednesday July 05 2023 08:31:19 EDT Ventricular Rate:  64 PR Interval:  138 QRS Duration:  91 QT Interval:  397 QTC Calculation: 410 R Axis:   80  Text Interpretation: Sinus rhythm Confirmed by Abby Hocking [phone]) on 07/05/2023 8:37:16 AM  Radiology DG  Chest 2 View Result Date: 07/05/2023 CLINICAL DATA:  chest pain EXAM: CHEST - 2 VIEW COMPARISON:  None available. FINDINGS: No focal airspace consolidation, pleural effusion, or pneumothorax. No cardiomegaly. No acute fracture or destructive lesion. IMPRESSION: No acute cardiopulmonary abnormality. Electronically Signed   By: Rance Burrows M.D.   On: 07/05/2023 08:53    Procedures Procedures    Medications Ordered in ED Medications - No data to display  ED Course/ Medical Decision Making/ A&P                                 Medical Decision Making 23 year old female presenting with chest pain. Differential diagnosis includes PE, ACS, pneumonia, pneumothorax, GERD, musculoskeletal pain.  Co morbidities that complicate the patient evaluation  Recent vaginal delivery, recent hormonal contraceptive use, h/o thrombocytopenia    Additional history obtained:  Additional history obtained from chart review External records from outside source obtained and reviewed including discharge summary from recent hospitalization following vaginal delivery November 2024   Lab Tests:  I Ordered, and personally interpreted labs.  The pertinent results include: CBC unremarkable, BMP notable for mild hypokalemia at 3.4.  D-dimer < 0.27, low suspicion for pulmonary embolism given this D-dimer within normal limits.  Troponin < 2, low suspicion for ACS.  Serum hCG negative.   Imaging Studies ordered:  I ordered imaging studies including chest x-ray I independently visualized and interpreted imaging which showed no acute cardiopulmonary findings I agree with the radiologist interpretation   Cardiac Monitoring: / EKG:  The patient was maintained on a cardiac monitor.  I personally viewed and interpreted the cardiac monitored which showed an underlying rhythm of: Sinus rhythm  Test / Admission - Considered:  Physical exam is reassuring, lungs are clear to auscultation bilaterally without  adventitious sounds, normal S1 and S2 on auscultation, no lower extremity edema.  Based on patient's presenting symptoms of chest pain with pleuritic component, considering recent hormonal contraceptive use will obtain D-dimer to rule out pulmonary embolism.  See above for lab interpretations.  Workup is reassuring, at this time I feel the patient is appropriate for discharge.  Advised patient to take 400 mg of ibuprofen  every 4-6 hours as needed for pain, return precautions discussed.  Patient is agreeable with this plan.    Amount and/or Complexity of Data Reviewed External Data Reviewed: notes.    Details: As above. Labs: ordered.    Details: As above. Radiology: ordered.    Details: As above. ECG/medicine  tests: ordered.    Details: As above.           Final Clinical Impression(s) / ED Diagnoses Final diagnoses:  Chest pain, unspecified type    Rx / DC Orders ED Discharge Orders     None         Adolm Ahumada 07/05/23 1007    Roberts Ching, MD 07/05/23 1043

## 2023-07-05 NOTE — ED Triage Notes (Signed)
 Pt reports central CP that began this morning around 5am. Pt reports pain increases on inspiration. Pt denies SHOB, dizziness, lightheadedness. Reports "plaque around my heart" as a child. VSS.

## 2024-02-27 ENCOUNTER — Encounter (HOSPITAL_COMMUNITY): Payer: Self-pay | Admitting: *Deleted

## 2024-02-27 ENCOUNTER — Ambulatory Visit (HOSPITAL_COMMUNITY)
Admission: EM | Admit: 2024-02-27 | Discharge: 2024-02-27 | Disposition: A | Discharge status: Home / Self Care | Attending: Emergency Medicine | Admitting: Emergency Medicine

## 2024-02-27 DIAGNOSIS — R110 Nausea: Secondary | ICD-10-CM | POA: Diagnosis not present

## 2024-02-27 DIAGNOSIS — R197 Diarrhea, unspecified: Secondary | ICD-10-CM | POA: Diagnosis not present

## 2024-02-27 DIAGNOSIS — R051 Acute cough: Secondary | ICD-10-CM

## 2024-02-27 DIAGNOSIS — B349 Viral infection, unspecified: Secondary | ICD-10-CM

## 2024-02-27 LAB — POCT URINE PREGNANCY: Preg Test, Ur: NEGATIVE

## 2024-02-27 LAB — POC COVID19/FLU A&B COMBO
Covid Antigen, POC: NEGATIVE
Influenza A Antigen, POC: NEGATIVE
Influenza B Antigen, POC: NEGATIVE

## 2024-02-27 MED ORDER — ONDANSETRON 4 MG PO TBDP: 4.0000 mg | ORAL_TABLET | Freq: Three times a day (TID) | ORAL | 0 refills | Status: AC | PRN

## 2024-02-27 NOTE — ED Triage Notes (Signed)
 Pt states she has had cough, congestion, a little blood in sputum, and some nausea X 3 days. Pt has not been taking any meds for sx.

## 2024-02-27 NOTE — ED Provider Notes (Signed)
 " [facility]    CSN: [id number] Arrival date & time: 02/27/24  1707      History   Chief Complaint Chief Complaint  Patient presents with   Nasal Congestion   Cough   Nausea    HPI Katie Baldwin is a 23 y.o. female.   Patient presents with cough, congestion, intermittent nausea, and intermittent diarrhea that began 2 to 3 days ago.  Patient states that she did have a productive cough earlier with a little bit of blood mixed in the sputum.  Patient reports that she has been some intermittent mild abdominal pain as well.  Patient denies chest pain, known fever, and vomiting.  Patient denies any known sick contacts.  Patient denies taking any medication for symptoms.  LMP 11/3.  The history is provided by the patient and medical records.  Cough   Past Medical History:  Diagnosis Date   Anemia    Medical history non-contributory    Normal labor 02/03/2023    Patient Active Problem List   Diagnosis Date Noted   Elevated blood pressure reading in office without diagnosis of hypertension 03/14/2023   Nexplanon  in place 03/14/2023   Alpha thalassemia silent carrier 10/17/2022   Gestational thrombocytopenia 07/08/2021    Past Surgical History:  Procedure Laterality Date   NO PAST SURGERIES      OB History     Gravida  2   Para  2   Term  2   Preterm      AB      Living  2      SAB      IAB      Ectopic      Multiple  0   Live Births  2            Home Medications    Prior to Admission medications  Medication Sig Start Date End Date Taking? Authorizing Provider  ondansetron  (ZOFRAN -ODT) 4 MG disintegrating tablet Take 1 tablet (4 mg total) by mouth every 8 (eight) hours as needed for nausea or vomiting. 02/27/24  Yes Johnie Flaming A, NP  Lactic Ac-Citric Ac-Pot Bitart (PHEXXI ) 1.8-1-0.4 % GEL Place 1 applicator vaginally once as needed for up to 1 dose. Use one per sexual encounter Patient not taking: Reported on 07/05/2023  06/15/23   Jeralyn Crutch, MD  Norethindrone  Acetate-Ethinyl Estrad-FE (LOESTRIN 24 FE) 1-20 MG-MCG(24) tablet Take 1 tablet by mouth daily. Patient not taking: Reported on 07/05/2023 06/15/23   Jeralyn Crutch, MD    Family History Family History  Problem Relation Age of Onset   Healthy Mother    Asthma Brother    Asthma Maternal Grandmother     Social History Social History[1]   Allergies   Patient has no known allergies.   Review of Systems Review of Systems  Respiratory:  Positive for cough.    Per HPI  Physical Exam Triage Vital Signs ED Triage Vitals  Encounter Vitals Group     BP 02/27/24 1915 117/75     Girls Systolic BP Percentile --      Girls Diastolic BP Percentile --      Boys Systolic BP Percentile --      Boys Diastolic BP Percentile --      Pulse Rate 02/27/24 1915 62     Resp 02/27/24 1915 16     Temp 02/27/24 1915 98.7 F (37.1 C)     Temp Source 02/27/24 1915 Oral     SpO2 02/27/24 1915  99 %     Weight --      Height --      Head Circumference --      Peak Flow --      Pain Score 02/27/24 1914 0     Pain Loc --      Pain Education --      Exclude from Growth Chart --    No data found.  Updated Vital Signs BP 117/75 (BP Location: Right Arm)   Pulse 62   Temp 98.7 F (37.1 C) (Oral)   Resp 16   LMP 01/08/2024   SpO2 99%   Breastfeeding No   Visual Acuity Right Eye Distance:   Left Eye Distance:   Bilateral Distance:    Right Eye Near:   Left Eye Near:    Bilateral Near:     Physical Exam Vitals and nursing note reviewed.  Constitutional:      General: She is awake. She is not in acute distress.    Appearance: Normal appearance. She is well-developed and well-groomed. She is not ill-appearing.  HENT:     Right Ear: Tympanic membrane, ear canal and external ear normal.     Left Ear: Tympanic membrane, ear canal and external ear normal.     Nose: Congestion and rhinorrhea present.     Mouth/Throat:     Mouth: Mucous  membranes are moist.     Pharynx: Posterior oropharyngeal erythema present. No oropharyngeal exudate.  Cardiovascular:     Rate and Rhythm: Normal rate and regular rhythm.  Pulmonary:     Effort: Pulmonary effort is normal.     Breath sounds: Normal breath sounds.  Abdominal:     General: Abdomen is flat. Bowel sounds are normal. There is no distension.     Palpations: Abdomen is soft. There is no mass.     Tenderness: There is no abdominal tenderness. There is no guarding or rebound.     Hernia: No hernia is present.  Skin:    General: Skin is warm and dry.  Neurological:     Mental Status: She is alert.  Psychiatric:        Behavior: Behavior is cooperative.      UC Treatments / Results  Labs (all labs ordered are listed, but only abnormal results are displayed) Labs Reviewed  POCT URINE PREGNANCY  POC COVID19/FLU A&B COMBO    EKG   Radiology No results found.  Procedures Procedures (including critical care time)  Medications Ordered in UC Medications - No data to display  Initial Impression / Assessment and Plan / UC Course  I have reviewed the triage vital signs and the nursing notes.  Pertinent labs & imaging results that were available during my care of the patient were reviewed by me and considered in my medical decision making (see chart for details).     Patient is overall well-appearing.  Vitals are stable.  Flu and COVID testing negative.  UPT also negative.  Suspect symptoms are likely viral in nature.  Prescribed Zofran  as needed for nausea and any vomiting.  Discussed over-the-counter medications for symptoms.  Discussed follow-up and return precautions. Final Clinical Impressions(s) / UC Diagnoses   Final diagnoses:  Acute cough  Nausea  Diarrhea, unspecified type  Viral syndrome     Discharge Instructions      COVID and flu testing were negative today.  I believe your symptoms are likely related to a viral illness. I prescribe Zofran   that you can take  every 8 hours as needed for nausea and vomiting. You can take over-the-counter Mucinex for cough and congestion. Alternate between Tylenol  and ibuprofen  as needed for any pain or fever. Make sure you are staying hydrated and getting plenty of rest. Follow-up with your primary care provider or return here as needed.   ED Prescriptions     Medication Sig Dispense Auth. Provider   ondansetron  (ZOFRAN -ODT) 4 MG disintegrating tablet Take 1 tablet (4 mg total) by mouth every 8 (eight) hours as needed for nausea or vomiting. 10 tablet Johnie Flaming A, NP      PDMP not reviewed this encounter.    [1]  Social History Tobacco Use   Smoking status: Never   Smokeless tobacco: Never  Vaping Use   Vaping status: Never Used  Substance Use Topics   Alcohol use: Never   Drug use: Not Currently    Types: Marijuana    Comment: last used marijuana 11/26/2020     Johnie Flaming A, NP 02/27/24 2008  "

## 2024-02-27 NOTE — Discharge Instructions (Signed)
 COVID and flu testing were negative today.  I believe your symptoms are likely related to a viral illness. I prescribe Zofran  that you can take every 8 hours as needed for nausea and vomiting. You can take over-the-counter Mucinex for cough and congestion. Alternate between Tylenol  and ibuprofen  as needed for any pain or fever. Make sure you are staying hydrated and getting plenty of rest. Follow-up with your primary care provider or return here as needed.
